# Patient Record
Sex: Female | Born: 1945 | Race: Black or African American | Hispanic: No | Marital: Single | Smoking: Never smoker
Health system: Southern US, Community
[De-identification: ages and names within clinical notes are randomized; demographics above are authoritative.]

## PROBLEM LIST (undated history)

## (undated) DIAGNOSIS — E7800 Pure hypercholesterolemia, unspecified: Secondary | ICD-10-CM

## (undated) DIAGNOSIS — E119 Type 2 diabetes mellitus without complications: Secondary | ICD-10-CM

## (undated) DIAGNOSIS — I10 Essential (primary) hypertension: Secondary | ICD-10-CM

## (undated) HISTORY — DX: Essential (primary) hypertension: I10

## (undated) HISTORY — DX: Pure hypercholesterolemia, unspecified: E78.00

## (undated) HISTORY — DX: Type 2 diabetes mellitus without complications: E11.9

## (undated) HISTORY — PX: OTHER SURGICAL HISTORY: SHX169

## (undated) HISTORY — PX: ABDOMINAL HYSTERECTOMY: SUR658

## (undated) HISTORY — PX: BREAST EXCISIONAL BIOPSY: SUR124

---

## 1979-04-17 HISTORY — PX: PARTIAL HYSTERECTOMY: SHX80

## 1988-04-16 HISTORY — PX: CERVICAL FUSION: SHX112

## 2001-04-16 HISTORY — PX: OTHER SURGICAL HISTORY: SHX169

## 2016-04-16 HISTORY — PX: OTHER SURGICAL HISTORY: SHX169

## 2016-07-11 DIAGNOSIS — M8588 Other specified disorders of bone density and structure, other site: Secondary | ICD-10-CM | POA: Diagnosis not present

## 2017-02-15 DIAGNOSIS — Z23 Encounter for immunization: Secondary | ICD-10-CM | POA: Diagnosis not present

## 2017-02-15 DIAGNOSIS — J309 Allergic rhinitis, unspecified: Secondary | ICD-10-CM | POA: Diagnosis not present

## 2017-02-15 DIAGNOSIS — I10 Essential (primary) hypertension: Secondary | ICD-10-CM | POA: Diagnosis not present

## 2017-02-15 DIAGNOSIS — E785 Hyperlipidemia, unspecified: Secondary | ICD-10-CM | POA: Diagnosis not present

## 2017-02-15 DIAGNOSIS — E119 Type 2 diabetes mellitus without complications: Secondary | ICD-10-CM | POA: Diagnosis not present

## 2017-03-13 ENCOUNTER — Other Ambulatory Visit: Payer: Self-pay | Admitting: Family Medicine

## 2017-03-13 ENCOUNTER — Ambulatory Visit
Admission: RE | Admit: 2017-03-13 | Discharge: 2017-03-13 | Disposition: A | Discharge status: Home / Self Care | Payer: Medicare Other | Source: Ambulatory Visit | Attending: Family Medicine | Admitting: Family Medicine

## 2017-03-13 DIAGNOSIS — M7521 Bicipital tendinitis, right shoulder: Secondary | ICD-10-CM | POA: Diagnosis not present

## 2017-03-13 DIAGNOSIS — E785 Hyperlipidemia, unspecified: Secondary | ICD-10-CM | POA: Diagnosis not present

## 2017-03-13 DIAGNOSIS — M7581 Other shoulder lesions, right shoulder: Secondary | ICD-10-CM | POA: Diagnosis not present

## 2017-03-13 DIAGNOSIS — E119 Type 2 diabetes mellitus without complications: Secondary | ICD-10-CM | POA: Diagnosis not present

## 2017-03-13 DIAGNOSIS — I10 Essential (primary) hypertension: Secondary | ICD-10-CM | POA: Diagnosis not present

## 2017-03-13 DIAGNOSIS — M25511 Pain in right shoulder: Secondary | ICD-10-CM

## 2017-03-13 DIAGNOSIS — M7501 Adhesive capsulitis of right shoulder: Secondary | ICD-10-CM | POA: Diagnosis not present

## 2017-03-13 DIAGNOSIS — M79601 Pain in right arm: Secondary | ICD-10-CM | POA: Diagnosis not present

## 2017-10-08 ENCOUNTER — Other Ambulatory Visit: Payer: Self-pay | Admitting: Physician Assistant

## 2017-10-08 DIAGNOSIS — Z1231 Encounter for screening mammogram for malignant neoplasm of breast: Secondary | ICD-10-CM

## 2017-10-11 ENCOUNTER — Other Ambulatory Visit: Payer: Self-pay | Admitting: Internal Medicine

## 2017-10-11 DIAGNOSIS — E042 Nontoxic multinodular goiter: Secondary | ICD-10-CM

## 2017-10-22 ENCOUNTER — Ambulatory Visit
Admission: RE | Admit: 2017-10-22 | Discharge: 2017-10-22 | Disposition: A | Discharge status: Home / Self Care | Payer: Medicare Other | Source: Ambulatory Visit | Attending: Internal Medicine | Admitting: Internal Medicine

## 2017-10-22 DIAGNOSIS — E042 Nontoxic multinodular goiter: Secondary | ICD-10-CM

## 2017-10-25 ENCOUNTER — Ambulatory Visit
Admission: RE | Admit: 2017-10-25 | Discharge: 2017-10-25 | Disposition: A | Discharge status: Home / Self Care | Payer: Medicare Other | Source: Ambulatory Visit | Attending: Physician Assistant | Admitting: Physician Assistant

## 2017-10-25 DIAGNOSIS — Z1231 Encounter for screening mammogram for malignant neoplasm of breast: Secondary | ICD-10-CM

## 2018-03-11 ENCOUNTER — Other Ambulatory Visit: Payer: Self-pay | Admitting: Physician Assistant

## 2018-03-11 DIAGNOSIS — R160 Hepatomegaly, not elsewhere classified: Secondary | ICD-10-CM

## 2018-03-29 ENCOUNTER — Ambulatory Visit
Admission: RE | Admit: 2018-03-29 | Discharge: 2018-03-29 | Disposition: A | Discharge status: Home / Self Care | Payer: Medicare Other | Source: Ambulatory Visit | Attending: Physician Assistant | Admitting: Physician Assistant

## 2018-03-29 DIAGNOSIS — R160 Hepatomegaly, not elsewhere classified: Secondary | ICD-10-CM

## 2018-03-29 MED ORDER — GADOBENATE DIMEGLUMINE 529 MG/ML IV SOLN
20.0000 mL | Freq: Once | INTRAVENOUS | Status: AC | PRN
  Administered 2018-03-29: 20 mL via INTRAVENOUS

## 2018-10-03 ENCOUNTER — Other Ambulatory Visit: Payer: Self-pay | Admitting: Physician Assistant

## 2018-10-03 DIAGNOSIS — Z1231 Encounter for screening mammogram for malignant neoplasm of breast: Secondary | ICD-10-CM

## 2018-10-15 IMAGING — CR DG SHOULDER 2+V*R*
3 series · 3 of 3 positions shown · non-contrast
Comparison: None.

CLINICAL DATA: 71-year-old female with acute right shoulder pain
and decreased range of motion after moving boxes.

EXAM:
RIGHT SHOULDER - 2+ VIEW

[w shoulder grashey right]
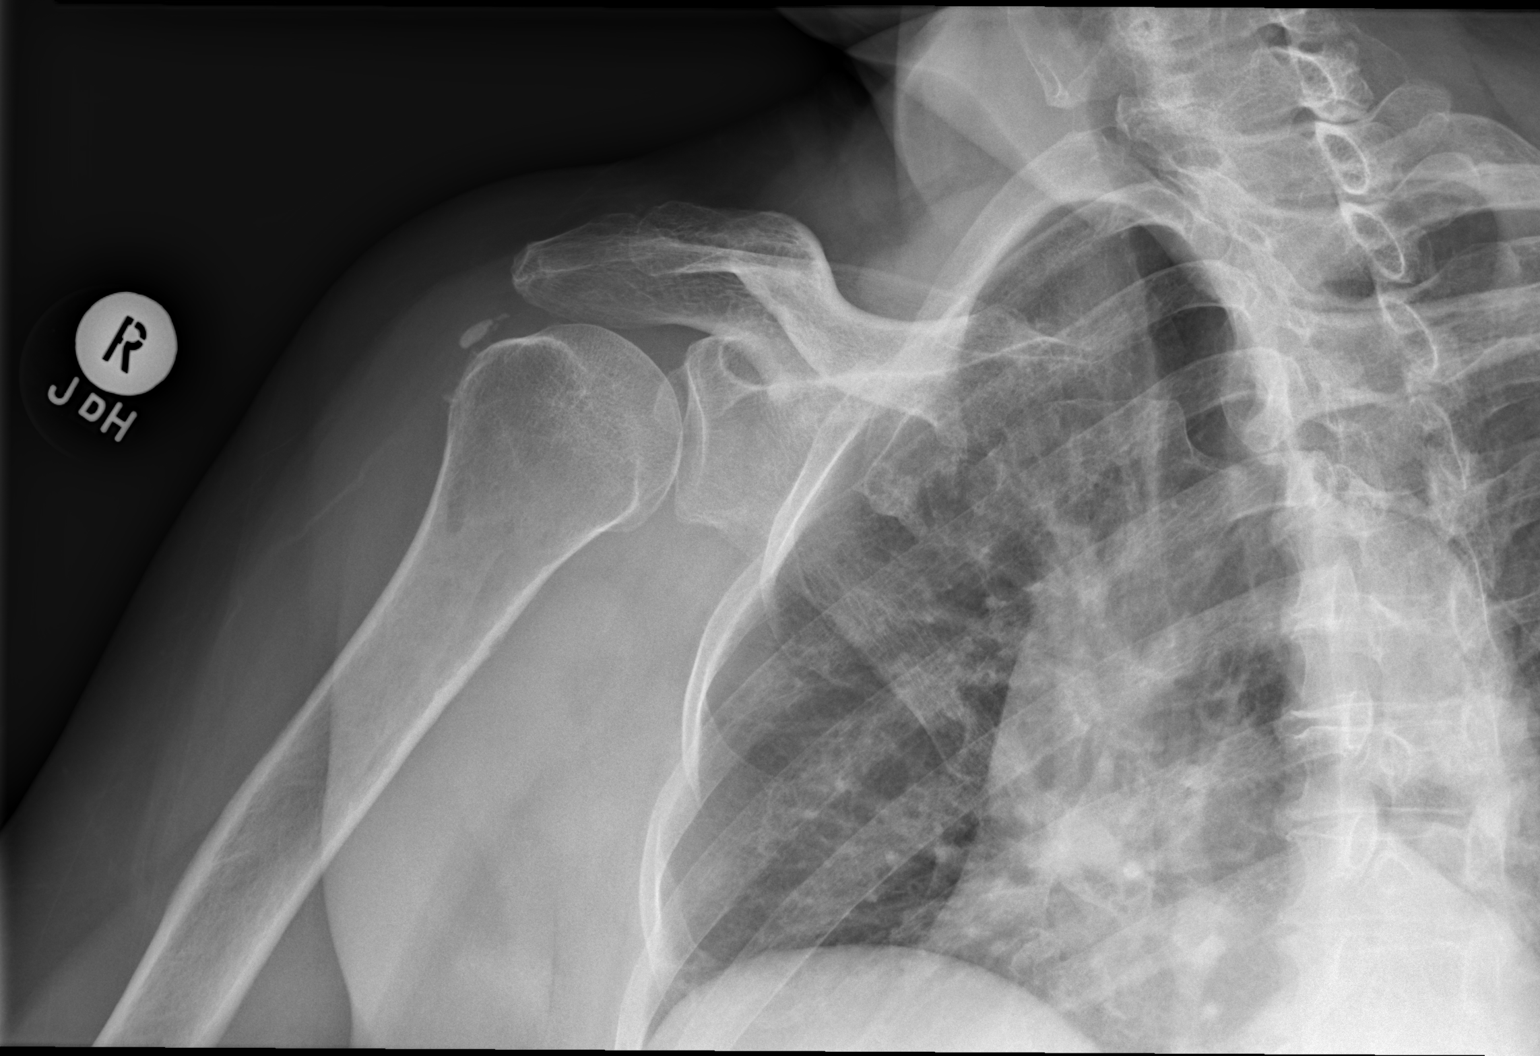

[w shoulder y-view right]
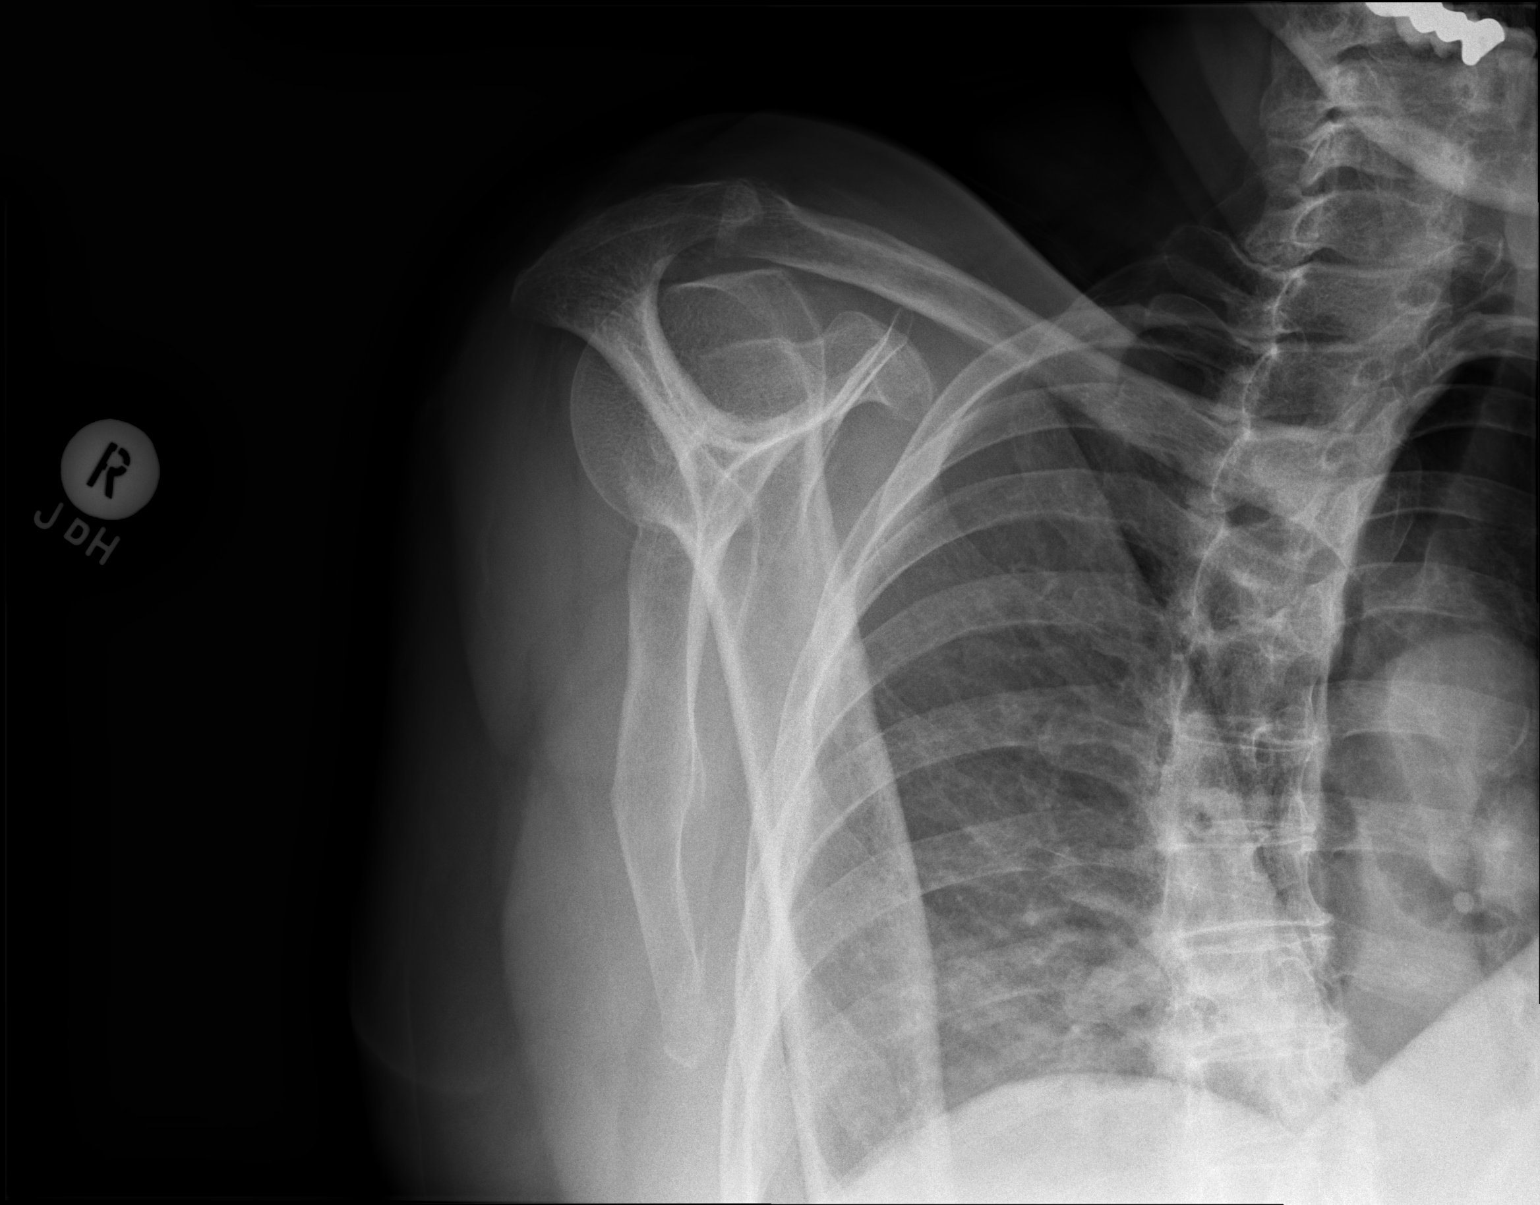

[w shoulder axillary right]
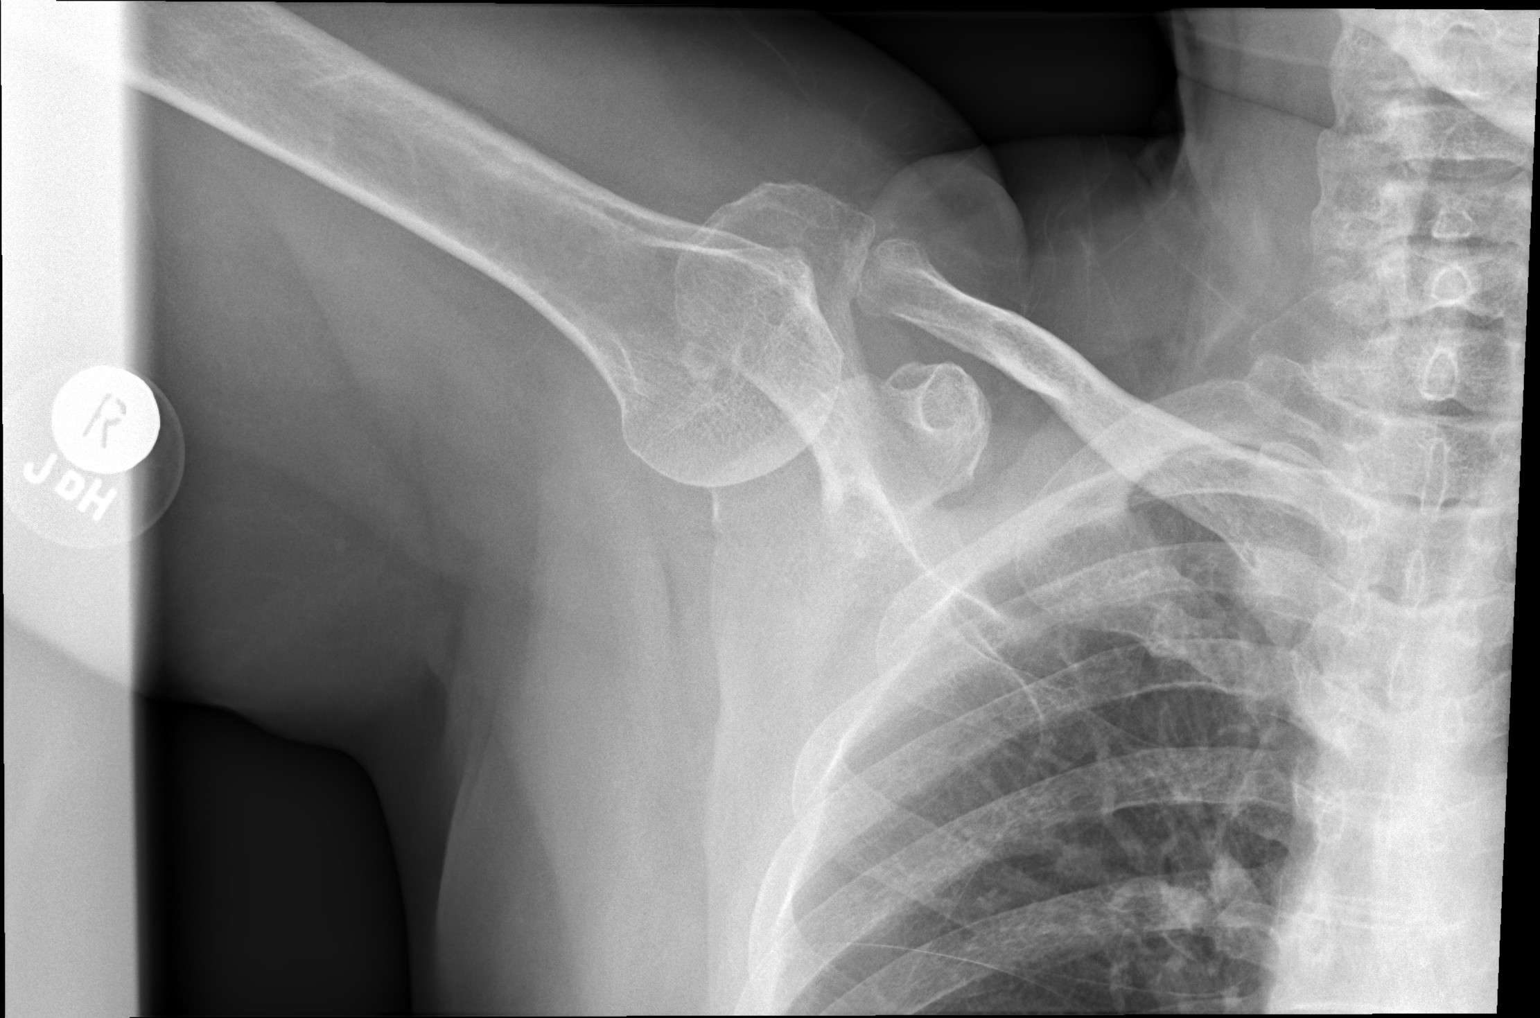

[3 of 3 positions shown; findings below may reference images not displayed]

FINDINGS: No evidence of acute fracture or malalignment. Soft tissue
calcifications are present in the region of the rotator cuff
insertion adjacent to the greater tuberosity. No significant
degenerative osteoarthritis. The visualized lungs are clear. No
lytic or blastic osseous lesion.
IMPRESSION: 1. Chronic calcific tendinosis in the region of the rotator cuff
insertion.
2. No evidence of significant arthritic changes in the
acromioclavicular or glenohumeral joints.

## 2018-11-19 ENCOUNTER — Other Ambulatory Visit: Payer: Self-pay

## 2018-11-19 ENCOUNTER — Ambulatory Visit
Admission: RE | Admit: 2018-11-19 | Discharge: 2018-11-19 | Disposition: A | Discharge status: Home / Self Care | Payer: Medicare Other | Source: Ambulatory Visit | Attending: Physician Assistant | Admitting: Physician Assistant

## 2018-11-19 DIAGNOSIS — Z1231 Encounter for screening mammogram for malignant neoplasm of breast: Secondary | ICD-10-CM

## 2019-07-18 ENCOUNTER — Other Ambulatory Visit: Payer: Self-pay

## 2019-07-18 ENCOUNTER — Encounter (HOSPITAL_COMMUNITY): Payer: Self-pay

## 2019-07-18 ENCOUNTER — Emergency Department (HOSPITAL_COMMUNITY): Payer: Medicare Other

## 2019-07-18 ENCOUNTER — Emergency Department (HOSPITAL_COMMUNITY)
Admission: EM | Admit: 2019-07-18 | Discharge: 2019-07-18 | Disposition: A | Discharge status: Home / Self Care | Payer: Medicare Other | Attending: Emergency Medicine | Admitting: Emergency Medicine

## 2019-07-18 DIAGNOSIS — W101XXA Fall (on)(from) sidewalk curb, initial encounter: Secondary | ICD-10-CM | POA: Insufficient documentation

## 2019-07-18 DIAGNOSIS — S4991XA Unspecified injury of right shoulder and upper arm, initial encounter: Secondary | ICD-10-CM | POA: Diagnosis present

## 2019-07-18 DIAGNOSIS — Y999 Unspecified external cause status: Secondary | ICD-10-CM | POA: Diagnosis not present

## 2019-07-18 DIAGNOSIS — Y9389 Activity, other specified: Secondary | ICD-10-CM | POA: Diagnosis not present

## 2019-07-18 DIAGNOSIS — S42201A Unspecified fracture of upper end of right humerus, initial encounter for closed fracture: Secondary | ICD-10-CM | POA: Insufficient documentation

## 2019-07-18 DIAGNOSIS — Y9289 Other specified places as the place of occurrence of the external cause: Secondary | ICD-10-CM | POA: Insufficient documentation

## 2019-07-18 MED ORDER — OXYCODONE-ACETAMINOPHEN 5-325 MG PO TABS: 1.0000 | ORAL_TABLET | Freq: Four times a day (QID) | ORAL | 0 refills | Status: DC | PRN

## 2019-07-18 MED ORDER — FENTANYL CITRATE (PF) 100 MCG/2ML IJ SOLN
50.0000 ug | Freq: Once | INTRAMUSCULAR | Status: AC
  Administered 2019-07-18: 50 ug via INTRAVENOUS
  Filled 2019-07-18: qty 2

## 2019-07-18 MED ORDER — ONDANSETRON HCL 4 MG/2ML IJ SOLN
4.0000 mg | Freq: Once | INTRAMUSCULAR | Status: AC
  Administered 2019-07-18: 4 mg via INTRAVENOUS
  Filled 2019-07-18: qty 2

## 2019-07-18 MED ORDER — OXYCODONE-ACETAMINOPHEN 5-325 MG PO TABS
1.0000 | ORAL_TABLET | Freq: Once | ORAL | Status: AC
  Administered 2019-07-18: 1 via ORAL
  Filled 2019-07-18: qty 1

## 2019-07-18 NOTE — ED Provider Notes (Signed)
[facility] EMERGENCY DEPARTMENT Provider Note   CSN: [id number] Arrival date & time: 07/18/19  1332     History Chief Complaint  Patient presents with  . mechanical fall    Laura Skinner is a 74 y.o. female.  The history is provided by the patient and the EMS personnel. No language interpreter was used.     74 year old female brought here via EMS for evaluation of recent fall.  Patient report prior to arrival she was walking towards her car when she accidentally tripped over the curb, fell and landed directly on her right shoulder.  She denies hitting her head or loss of consciousness.  She report acute onset of sharp intense 10 out of 10 pain about the shoulder with difficulty ranging the shoulder due to pain.  EMS was contacted and patient was given 100 mcg of fentanyl prior to arrival.  She did report pain medication did help with her discomfort.  She denies headache, neck pain, chest pain or trouble breathing, abdominal pain, wrist pain, elbow pain.  She is right-hand dominant.  She is not on any blood thinner medication.  She denies any precipitating symptoms prior to the fall.  No past medical history on file.  There are no problems to display for this patient.   Past Surgical History:  Procedure Laterality Date  . BREAST EXCISIONAL BIOPSY Left      OB History   No obstetric history on file.     Family History  Problem Relation Age of Onset  . Breast cancer Cousin     Social History   Tobacco Use  . Smoking status: Never Smoker  . Smokeless tobacco: Never Used  Substance Use Topics  . Alcohol use: Not Currently  . Drug use: Not Currently    Home Medications Prior to Admission medications   Not on File    Allergies    Patient has no known allergies.  Review of Systems   Review of Systems  All other systems reviewed and are negative.   Physical Exam Updated Vital Signs BP (!) 148/57   Pulse 88   Temp 98.3 F (36.8 C) (Oral)    Resp 14   Ht 5\' 4"  (1.626 m)   Wt 102.5 kg   SpO2 98%   BMI 38.79 kg/m   Physical Exam Vitals and nursing note reviewed.  Constitutional:      General: She is not in acute distress.    Appearance: She is well-developed. She is obese.  HENT:     Head: Normocephalic and atraumatic.  Eyes:     Conjunctiva/sclera: Conjunctivae normal.  Cardiovascular:     Rate and Rhythm: Normal rate and regular rhythm.     Pulses: Normal pulses.     Heart sounds: Normal heart sounds.  Pulmonary:     Effort: Pulmonary effort is normal.     Breath sounds: Normal breath sounds.     Comments: No chest tenderness, no clavicular tenderness. Chest:     Chest wall: No tenderness.  Abdominal:     Palpations: Abdomen is soft.  Musculoskeletal:        General: Tenderness (Right shoulder: Exquisite tenderness to the palpation of the shoulder joint with decreased range of motion secondary to pain.  No obvious deformity or crepitus noted.  Sensation is intact.) present.     Cervical back: Neck supple.     Comments: No tenderness to right elbow or right wrist.  Radial pulse 2+.  No scalp tenderness.  No cervical midline spine tenderness.  Skin:    Findings: No rash.  Neurological:     Mental Status: She is alert.     ED Results / Procedures / Treatments   Labs (all labs ordered are listed, but only abnormal results are displayed) Labs Reviewed - No data to display  EKG None  Radiology DG Shoulder Right  Result Date: 07/18/2019 CLINICAL DATA:  Fall EXAM: RIGHT SHOULDER - 2+ VIEW COMPARISON:  03/13/2017 FINDINGS: Comminuted, displaced fractures of the proximal right humerus, involving at least the greater tuberosity and likely the surgical neck. Mild acromioclavicular arthrosis. The partially imaged right chest is unremarkable. IMPRESSION: Comminuted, displaced fractures of the proximal right humerus, involving at least the greater tuberosity and likely the surgical neck. Consider CT to better evaluate  fracture anatomy. Electronically Signed   By: Eddie Candle M.D.   On: 07/18/2019 14:54    Procedures Procedures (including critical care time)  Medications Ordered in ED Medications  fentaNYL (SUBLIMAZE) injection 50 mcg (50 mcg Intravenous Given 07/18/19 1420)  ondansetron (ZOFRAN) injection 4 mg (4 mg Intravenous Given 07/18/19 1419)  oxyCODONE-acetaminophen (PERCOCET/ROXICET) 5-325 MG per tablet 1 tablet (1 tablet Oral Given 07/18/19 1534)    ED Course  I have reviewed the triage vital signs and the nursing notes.  Pertinent labs & imaging results that were available during my care of the patient were reviewed by me and considered in my medical decision making (see chart for details).    MDM Rules/Calculators/A&P                      BP (!) 151/61   Pulse 88   Temp 98.3 F (36.8 C) (Oral)   Resp 13   Ht 5\' 4"  (1.626 m)   Wt 102.5 kg   SpO2 98%   BMI 38.79 kg/m   Final Clinical Impression(s) / ED Diagnoses Final diagnoses:  Closed fracture of proximal end of right humerus, initial encounter    Rx / DC Orders ED Discharge Orders         Ordered    oxyCODONE-acetaminophen (PERCOCET) 5-325 MG tablet  Every 6 hours PRN     07/18/19 1532         2:04 PM Patient had mechanical fall, when she tripped on a curb, fell, and landed directly on her right shoulder.  No other injury noted aside from exquisite pain about the shoulder.  Will obtain x-ray.  Pain medication given.  Patient is neurovascular intact.  3:23 PM Xray of R shoulder demonstrates comminuted displaced fractures of the proximal right humerus, involving at least the greater tubrosity and likely surgical neck.  COnsider CT to better evaluate fracture anatomy.  I discussed care with Dr. Wilson Singer who recommend sling and pt can f/u outpt with orthopedist next week for further management.  I discussed care with pt who agrees.  Will provide sling and will prescribe opiate pain medication.  Pt understands opiate can cause  drowsiness.  Return precaution given.     Domenic Moras, PA-C 07/18/19 1534    Charlesetta Shanks, MD 07/28/19 1511

## 2019-07-18 NOTE — ED Triage Notes (Signed)
[facility] reports pt fell by tripping over curbing. No loc, weakness, only mechanical. C/O  Right shoulder pain. Pt given a total of 100 mcg of fentanyl. Pt was HTN, with hx of HTN, HLD, and DM. Reports morning meds taken. Son aware and will be coming to hospital.

## 2019-07-18 NOTE — Discharge Instructions (Signed)
You have suffered a broken right shoulder.  Wear sling for support.  Take pain medication as prescribed but be aware that it may cause drowsiness.  Call and follow up with orthopedist next week for further management.

## 2019-07-18 NOTE — ED Notes (Signed)
Patient verbalizes understanding of discharge instructions. Opportunity for questioning and answers were provided. Armband removed by staff, pt discharged from ED.  

## 2019-07-18 NOTE — Progress Notes (Signed)
Orthopedic Tech Progress Note Patient Details:  Laura Skinner 04-29-1945 [id number]  Ortho Devices Type of Ortho Device: Arm sling       [name] 07/18/2019, 4:06 PM

## 2019-07-18 NOTE — ED Notes (Signed)
Wasted 50 mcg 1 ml of fentanyl with Fraser Din RN

## 2019-07-18 NOTE — ED Notes (Signed)
Patient transported to X-ray 

## 2019-07-18 NOTE — ED Notes (Signed)
This RN witnessed the waste of Fentanyl with [employer]

## 2019-07-20 ENCOUNTER — Other Ambulatory Visit: Payer: Self-pay

## 2019-07-20 ENCOUNTER — Encounter: Payer: Self-pay | Admitting: Orthopedic Surgery

## 2019-07-20 ENCOUNTER — Ambulatory Visit: Payer: Medicare Other | Admitting: Orthopedic Surgery

## 2019-07-20 VITALS — Ht 64.0 in | Wt 226.0 lb

## 2019-07-20 DIAGNOSIS — S42291A Other displaced fracture of upper end of right humerus, initial encounter for closed fracture: Secondary | ICD-10-CM | POA: Diagnosis not present

## 2019-07-20 MED ORDER — HYDROCODONE-ACETAMINOPHEN 5-325 MG PO TABS: 1.0000 | ORAL_TABLET | ORAL | 0 refills | Status: DC | PRN

## 2019-07-20 NOTE — Progress Notes (Signed)
   Office Visit Note   Patient: Laura Skinner           Date of Birth: July 21, 1945           MRN: [id number] Visit Date: 07/20/2019              Requested by: No referring provider defined for this encounter. PCP: Hoyt Koch, MD (Inactive)  Chief Complaint  Patient presents with  . Right Shoulder - Pain    S/p fall 07/18/2019 follow up from ER       HPI: Patient is a 74 year old woman who fell onto her right shoulder 2 days ago.  She sustained a comminuted fracture of the right proximal humerus.  She is seen for initial evaluation in a sling she complains of bruising and pain she states the Percocet from the hospital made her nauseated.  Past medical history negative for tobacco negative for diabetes.  Assessment & Plan: Visit Diagnoses:  1. Closed 3-part fracture of proximal end of right humerus, initial encounter     Plan: Recommended proceeding with conservative treatment initially.  Will have her use a sling do pendulum exercises work on range of motion exercises of the hand wrist and elbow and repeat 2 view radiographs of the right shoulder at follow-up.  Prescription for Vicodin written.  Follow-Up Instructions: Return in about 3 weeks (around 08/10/2019).   Ortho Exam  Patient is alert, oriented, no adenopathy, well-dressed, normal affect, normal respiratory effort. Examination patient has ecchymosis and bruising in the right shoulder region the clavicle and AC joint are not tender to palpation.  Radiographs are reviewed which shows an active minimal three-part proximal humerus fracture with a reduced glenohumeral joint.  Imaging: No results found. No images are attached to the encounter.  Labs: No results found for: HGBA1C, ESRSEDRATE, CRP, LABURIC, REPTSTATUS, GRAMSTAIN, CULT, LABORGA   No results found for: ALBUMIN, PREALBUMIN, LABURIC  No results found for: MG No results found for: VD25OH  No results found for: PREALBUMIN No flowsheet data found.   Body mass  index is 38.79 kg/m.  Orders:  No orders of the defined types were placed in this encounter.  Meds ordered this encounter  Medications  . HYDROcodone-acetaminophen (NORCO/VICODIN) 5-325 MG tablet    Sig: Take 1 tablet by mouth every 4 (four) hours as needed for moderate pain.    Dispense:  30 tablet    Refill:  0     Procedures: No procedures performed  Clinical Data: No additional findings.  ROS:  All other systems negative, except as noted in the HPI. Review of Systems  Objective: Vital Signs: Ht 5\' 4"  (1.626 m)   Wt 226 lb (102.5 kg)   BMI 38.79 kg/m   Specialty Comments:  No specialty comments available.  PMFS History: There are no problems to display for this patient.  History reviewed. No pertinent past medical history.  Family History  Problem Relation Age of Onset  . Breast cancer Cousin     Past Surgical History:  Procedure Laterality Date  . BREAST EXCISIONAL BIOPSY Left    Social History   Occupational History  . Not on file  Tobacco Use  . Smoking status: Never Smoker  . Smokeless tobacco: Never Used  Substance and Sexual Activity  . Alcohol use: Not Currently  . Drug use: Not Currently  . Sexual activity: Not on file

## 2019-08-10 ENCOUNTER — Ambulatory Visit: Payer: Medicare Other | Admitting: Orthopedic Surgery

## 2019-08-10 ENCOUNTER — Telehealth: Payer: Self-pay

## 2019-08-10 ENCOUNTER — Encounter: Payer: Self-pay | Admitting: Orthopedic Surgery

## 2019-08-10 ENCOUNTER — Other Ambulatory Visit: Payer: Self-pay

## 2019-08-10 ENCOUNTER — Ambulatory Visit: Payer: Self-pay

## 2019-08-10 DIAGNOSIS — G8929 Other chronic pain: Secondary | ICD-10-CM | POA: Diagnosis not present

## 2019-08-10 DIAGNOSIS — M25511 Pain in right shoulder: Secondary | ICD-10-CM

## 2019-08-10 DIAGNOSIS — M7541 Impingement syndrome of right shoulder: Secondary | ICD-10-CM

## 2019-08-10 MED ORDER — METHYLPREDNISOLONE ACETATE 40 MG/ML IJ SUSP: 40.0000 mg | INTRAMUSCULAR | Status: DC | PRN

## 2019-08-10 MED ORDER — LIDOCAINE HCL 1 % IJ SOLN: 5.0000 mL | INTRAMUSCULAR | Status: DC | PRN

## 2019-08-10 NOTE — Progress Notes (Signed)
   Office Visit Note   Patient: Laura Skinner           Date of Birth: 1946-04-13           MRN: [id number] Visit Date: 08/10/2019              Requested by: No referring provider defined for this encounter. PCP: Hoyt Koch, MD (Inactive)  Chief Complaint  Patient presents with  . Right Shoulder - Follow-up    DOI 07/16/19 f/u s/p fall proximal humerus fx       HPI: This is a pleasant woman who is now 3 weeks status post right comminuted proximal humerus fracture.  She has been doing well.  She has been working on pendulum exercises on her own.  She admits she is leery about removing the sling but is gotten around this to keep mobility in her arm  Assessment & Plan: Visit Diagnoses:  1. Chronic right shoulder pain   2. Impingement syndrome of right shoulder     Plan: The patient lives alone and is homebound because of her fracture.  We will have her start physical therapy for active and passive range of motion of her right shoulder elbow and wrist.  And some progression to just very light strengthening.  Follow-up in 3 weeks at which time x-rays of her right shoulder should be taken.  Follow-Up Instructions: Return in about 4 weeks (around 09/07/2019).   Ortho Exam  Patient is alert, oriented, no adenopathy, well-dressed, normal affect, normal respiratory effort. Right arm: Distal CMS is intact she has active extension and flexion of her wrist.  Minimal soft tissue swelling minimally tender to palpation over the shoulder  Imaging: XR Shoulder Right  Result Date: 08/10/2019 2 view radiographs of the right shoulder shows no bony abnormalities.  No images are attached to the encounter.  Labs: No results found for: HGBA1C, ESRSEDRATE, CRP, LABURIC, REPTSTATUS, GRAMSTAIN, CULT, LABORGA   No results found for: ALBUMIN, PREALBUMIN, LABURIC  No results found for: MG No results found for: VD25OH  No results found for: PREALBUMIN No flowsheet data found.   There is no  height or weight on file to calculate BMI.  Orders:  Orders Placed This Encounter  Procedures  . Large Joint Inj: R subacromial bursa  . XR Shoulder Right   No orders of the defined types were placed in this encounter.    Procedures: No procedures performed  Clinical Data: No additional findings.  ROS:  All other systems negative, except as noted in the HPI. Review of Systems  Objective: Vital Signs: There were no vitals taken for this visit.  Specialty Comments:  No specialty comments available.  PMFS History: There are no problems to display for this patient.  History reviewed. No pertinent past medical history.  Family History  Problem Relation Age of Onset  . Breast cancer Cousin     Past Surgical History:  Procedure Laterality Date  . BREAST EXCISIONAL BIOPSY Left    Social History   Occupational History  . Not on file  Tobacco Use  . Smoking status: Never Smoker  . Smokeless tobacco: Never Used  Substance and Sexual Activity  . Alcohol use: Not Currently  . Drug use: Not Currently  . Sexual activity: Not on file

## 2019-08-10 NOTE — Progress Notes (Addendum)
   Office Visit Note   Patient: Laura Skinner           Date of Birth: 1945-10-09           MRN: [id number] Visit Date: 08/10/2019              Requested by: No referring provider defined for this encounter. PCP: Hoyt Koch, MD (Inactive)  Chief Complaint  Patient presents with  . Right Shoulder - Follow-up    DOI 07/16/19 f/u s/p fall proximal humerus fx       HPI: Patient is a 74 year old woman who presents in follow-up for a proximal right humerus fracture.  She is about 4 weeks out from her injury.   Assessment & Plan: Visit Diagnoses:  1. Chronic right shoulder pain   2. Impingement syndrome of right shoulder     Plan: Patient states that she cannot get out of the house on her own we will try to set her up for home health therapy for range of motion and strengthening of the right shoulder.  Follow-Up Instructions: Return in about 4 weeks (around 09/07/2019).   Ortho Exam  Patient is alert, oriented, no adenopathy, well-dressed, normal affect, normal respiratory effort. Examination patient has decreased range of motion of the right shoulder.  There is no skin breakdown.  Radiographs shows consolidating fracture of the proximal humerus.  Imaging: XR Shoulder Right  Result Date: 08/10/2019 2 view radiographs of the right shoulder shows no bony abnormalities.  No images are attached to the encounter.  Labs: No results found for: HGBA1C, ESRSEDRATE, CRP, LABURIC, REPTSTATUS, GRAMSTAIN, CULT, LABORGA   No results found for: ALBUMIN, PREALBUMIN, LABURIC  No results found for: MG No results found for: VD25OH  No results found for: PREALBUMIN No flowsheet data found.   There is no height or weight on file to calculate BMI.  Orders:  Orders Placed This Encounter  Procedures  . XR Shoulder Right   No orders of the defined types were placed in this encounter.    Procedures: No procedures performed  Clinical Data: No additional findings.  ROS:  All other  systems negative, except as noted in the HPI. Review of Systems  Objective: Vital Signs: There were no vitals taken for this visit.  Specialty Comments:  No specialty comments available.  PMFS History: There are no problems to display for this patient.  History reviewed. No pertinent past medical history.  Family History  Problem Relation Age of Onset  . Breast cancer Cousin     Past Surgical History:  Procedure Laterality Date  . BREAST EXCISIONAL BIOPSY Left    Social History   Occupational History  . Not on file  Tobacco Use  . Smoking status: Never Smoker  . Smokeless tobacco: Never Used  Substance and Sexual Activity  . Alcohol use: Not Currently  . Drug use: Not Currently  . Sexual activity: Not on file

## 2019-08-10 NOTE — Telephone Encounter (Signed)
Referral for HHPT faxed to [facility] for physical therapy eval and treat right proximal humeral fx work on ROM and light strengthening.

## 2019-08-17 ENCOUNTER — Telehealth: Payer: Self-pay | Admitting: Orthopedic Surgery

## 2019-08-17 NOTE — Telephone Encounter (Signed)
Patient called. Says she would like for Autumn to call her. She says she has not heard anything from anyone about physical therapy. Her call back number is [phone]

## 2019-08-17 NOTE — Telephone Encounter (Signed)
Called Laura Skinner with [facility] and he received the referral and will contact the office once processed.

## 2019-08-18 NOTE — Telephone Encounter (Signed)
Pt on sch for this week with physical therapy.

## 2019-09-07 ENCOUNTER — Ambulatory Visit: Payer: Medicare Other | Admitting: Orthopedic Surgery

## 2019-09-08 ENCOUNTER — Ambulatory Visit: Payer: Medicare Other | Admitting: Physician Assistant

## 2019-09-08 ENCOUNTER — Encounter: Payer: Self-pay | Admitting: Orthopedic Surgery

## 2019-09-08 ENCOUNTER — Other Ambulatory Visit: Payer: Self-pay

## 2019-09-08 VITALS — Ht 64.0 in | Wt 226.0 lb

## 2019-09-08 DIAGNOSIS — S42291A Other displaced fracture of upper end of right humerus, initial encounter for closed fracture: Secondary | ICD-10-CM

## 2019-09-08 NOTE — Progress Notes (Signed)
   Office Visit Note   Patient: Laura Skinner           Date of Birth: [birth year]           MRN: [id number] Visit Date: 09/08/2019              Requested by: No referring provider defined for this encounter. PCP: Hoyt Koch, MD (Inactive)  Chief Complaint  Patient presents with  . Right Shoulder - Follow-up    DOI 07/16/19 s/p fall proximal humerus fracture.       HPI: This is a pleasant 74 year old woman who is 6 weeks status post fall and fracturing her proximal humerus.  On the right.  She is doing well and has been very successful with home physical therapy she cannot drive because it is her right arm so she is homebound.  She feels she is making progress and her pain has decreased  Assessment & Plan: Visit Diagnoses: No diagnosis found.  Plan: She should continue with physical therapy as I believe there is still in need in follow-up for final visit in 6 weeks.  We should get x-rays of her right ankle shoulder at that timeshpulder at that time  Follow-Up Instructions: No follow-ups on file.   Ortho Exam  Patient is alert, oriented, no adenopathy, well-dressed, normal affect, normal respiratory effort. Focused examination of her right shoulder demonstrates some tenderness to deep palpation over the proximal humerus.  She has abduction to 80 degrees.  She has internal rotation to her beltline.  Forward elevation to about 90 degrees  Imaging: No results found. No images are attached to the encounter.  Labs: No results found for: HGBA1C, ESRSEDRATE, CRP, LABURIC, REPTSTATUS, GRAMSTAIN, CULT, LABORGA   No results found for: ALBUMIN, PREALBUMIN, LABURIC  No results found for: MG No results found for: VD25OH  No results found for: PREALBUMIN No flowsheet data found.   Body mass index is 38.79 kg/m.  Orders:  No orders of the defined types were placed in this encounter.  No orders of the defined types were placed in this encounter.    Procedures: No procedures  performed  Clinical Data: No additional findings.  ROS:  All other systems negative, except as noted in the HPI. Review of Systems  Objective: Vital Signs: Ht 5\' 4"  (1.626 m)   Wt 226 lb (102.5 kg)   BMI 38.79 kg/m   Specialty Comments:  No specialty comments available.  PMFS History: There are no problems to display for this patient.  No past medical history on file.  Family History  Problem Relation Age of Onset  . Breast cancer Cousin     Past Surgical History:  Procedure Laterality Date  . BREAST EXCISIONAL BIOPSY Left    Social History   Occupational History  . Not on file  Tobacco Use  . Smoking status: Never Smoker  . Smokeless tobacco: Never Used  Substance and Sexual Activity  . Alcohol use: Not Currently  . Drug use: Not Currently  . Sexual activity: Not on file

## 2019-09-28 ENCOUNTER — Other Ambulatory Visit: Payer: Self-pay | Admitting: Physician Assistant

## 2019-09-28 DIAGNOSIS — Z1231 Encounter for screening mammogram for malignant neoplasm of breast: Secondary | ICD-10-CM

## 2019-10-20 ENCOUNTER — Ambulatory Visit: Payer: Self-pay

## 2019-10-20 ENCOUNTER — Other Ambulatory Visit: Payer: Self-pay

## 2019-10-20 ENCOUNTER — Ambulatory Visit (INDEPENDENT_AMBULATORY_CARE_PROVIDER_SITE_OTHER): Payer: Medicare Other | Admitting: Physician Assistant

## 2019-10-20 ENCOUNTER — Encounter: Payer: Self-pay | Admitting: Orthopedic Surgery

## 2019-10-20 VITALS — Ht 64.0 in | Wt 226.0 lb

## 2019-10-20 DIAGNOSIS — S42291A Other displaced fracture of upper end of right humerus, initial encounter for closed fracture: Secondary | ICD-10-CM

## 2019-10-20 NOTE — Progress Notes (Signed)
   Office Visit Note   Patient: Laura Skinner           Date of Birth: [birth year]           MRN: [id number] Visit Date: 10/20/2019              Requested by: No referring provider defined for this encounter. PCP: Hoyt Koch, MD (Inactive)  Chief Complaint  Patient presents with  . Right Shoulder - Follow-up    DOI 07/16/19 s/p fall proximal humerus fracture.      HPI: This is a pleasant 74 year old woman who is 3 months status post proximal humerus fracture on the right.  She has been working with physical therapy and completed her PT.  She did not 100% but she does feel quite a bit better.  She is wondering if further PT with an emphasis on strengthening would be helpful  Assessment & Plan: Visit Diagnoses:  1. Closed 3-part fracture of proximal end of right humerus, initial encounter     Plan: I am happy to write for more physical therapy.  She is going to go to [place] until August.  I told her to use the shoulder as she wishes and to keep it stretched out.  When she returns if she feels she would like to continue with PT she may contact our office  Follow-Up Instructions: No follow-ups on file.   Ortho Exam  Patient is alert, oriented, no adenopathy, well-dressed, normal affect, normal respiratory effort. Right shoulder: No soft tissue swelling mildly tender to deep palpation.  She has internal rotation to just above her beltline she has abduction to almost 90 degrees.  Strength is almost equivalent bilaterally sensation is intact Imaging: No results found. No images are attached to the encounter.  Labs: No results found for: HGBA1C, ESRSEDRATE, CRP, LABURIC, REPTSTATUS, GRAMSTAIN, CULT, LABORGA   No results found for: ALBUMIN, PREALBUMIN, LABURIC  No results found for: MG No results found for: VD25OH  No results found for: PREALBUMIN No flowsheet data found.   Body mass index is 38.79 kg/m.  Orders:  Orders Placed This Encounter  Procedures  . XR Shoulder  Right   No orders of the defined types were placed in this encounter.    Procedures: No procedures performed  Clinical Data: No additional findings.  ROS:  All other systems negative, except as noted in the HPI. Review of Systems  Objective: Vital Signs: Ht 5\' 4"  (1.626 m)   Wt 226 lb (102.5 kg)   BMI 38.79 kg/m   Specialty Comments:  No specialty comments available.  PMFS History: There are no problems to display for this patient.  No past medical history on file.  Family History  Problem Relation Age of Onset  . Breast cancer Cousin     Past Surgical History:  Procedure Laterality Date  . BREAST EXCISIONAL BIOPSY Left    Social History   Occupational History  . Not on file  Tobacco Use  . Smoking status: Never Smoker  . Smokeless tobacco: Never Used  Vaping Use  . Vaping Use: Never used  Substance and Sexual Activity  . Alcohol use: Not Currently  . Drug use: Not Currently  . Sexual activity: Not on file

## 2019-10-22 ENCOUNTER — Other Ambulatory Visit: Payer: Self-pay | Admitting: Physician Assistant

## 2019-10-22 DIAGNOSIS — Z13820 Encounter for screening for osteoporosis: Secondary | ICD-10-CM

## 2019-11-20 ENCOUNTER — Ambulatory Visit: Payer: Medicare Other

## 2019-12-04 ENCOUNTER — Ambulatory Visit
Admission: RE | Admit: 2019-12-04 | Discharge: 2019-12-04 | Disposition: A | Discharge status: Home / Self Care | Payer: Medicare Other | Source: Ambulatory Visit | Attending: Physician Assistant | Admitting: Physician Assistant

## 2019-12-04 ENCOUNTER — Other Ambulatory Visit: Payer: Self-pay

## 2019-12-04 DIAGNOSIS — Z1231 Encounter for screening mammogram for malignant neoplasm of breast: Secondary | ICD-10-CM

## 2019-12-15 ENCOUNTER — Telehealth: Payer: Self-pay

## 2019-12-15 DIAGNOSIS — S42291A Other displaced fracture of upper end of right humerus, initial encounter for closed fracture: Secondary | ICD-10-CM

## 2019-12-15 NOTE — Telephone Encounter (Signed)
Patient called  Patient is calling to get PT.  Call back:[phone]

## 2019-12-15 NOTE — Telephone Encounter (Signed)
Per Corrie Dandy Anne's last office note, she will be glad to write PT rx for patient. I left voicemail for patient requesting return phone call to let us know which PT facility she would like for referral to go to.

## 2019-12-16 NOTE — Telephone Encounter (Signed)
Range of motion strengthening all sounds great

## 2019-12-16 NOTE — Telephone Encounter (Signed)
Per your last office note, ok to order PT if patient decided that she wants.  She would like to come to our PT facility.  Would you like for this to be Eval and treat-ROM and strengthening? Any special protocols or restrictions?

## 2019-12-16 NOTE — Addendum Note (Signed)
Addended by: Rogers Seeds on: 12/16/2019 02:36 PM   Modules accepted: Orders

## 2019-12-16 NOTE — Telephone Encounter (Signed)
Referral entered  

## 2019-12-23 ENCOUNTER — Ambulatory Visit: Payer: Medicare Other | Admitting: Physical Therapy

## 2019-12-23 ENCOUNTER — Other Ambulatory Visit: Payer: Self-pay

## 2019-12-23 ENCOUNTER — Encounter: Payer: Self-pay | Admitting: Physical Therapy

## 2019-12-23 DIAGNOSIS — R600 Localized edema: Secondary | ICD-10-CM | POA: Diagnosis not present

## 2019-12-23 DIAGNOSIS — M25611 Stiffness of right shoulder, not elsewhere classified: Secondary | ICD-10-CM

## 2019-12-23 DIAGNOSIS — M6281 Muscle weakness (generalized): Secondary | ICD-10-CM | POA: Diagnosis not present

## 2019-12-23 DIAGNOSIS — M25511 Pain in right shoulder: Secondary | ICD-10-CM

## 2019-12-23 NOTE — Patient Instructions (Signed)
Access Code: [email or web address] URL: [email or web address] Date: 12/23/2019 Prepared by: Ivery Quale  Exercises Supine Shoulder Flexion Extension AAROM with Dowel - 3 x daily - 7 x weekly - 10 reps - 1 sets Supine Shoulder External Rotation in 45 Degrees Abduction AAROM with Dowel - 3 x daily - 7 x weekly - 10 reps - 1 sets Shoulder Scaption AAROM with Dowel - 3 x daily - 7 x weekly - 10 reps - 1 sets Seated Shoulder Abduction Towel Slide at Table Top - 2 x daily - 6 x weekly - 10 reps - 1-2 sets - 5 hold Seated Shoulder External Rotation PROM on Table - 2-3 x daily - 6 x weekly - 10 reps - 1-2 sets - 5 hold Standing Row with Anchored Resistance - 2 x daily - 6 x weekly - 10-20 reps - 2-3 sets

## 2019-12-23 NOTE — Therapy (Signed)
[facility] Physical Therapy [address] [place], [place], [address] Phone: [phone]   Fax:  [phone]  Physical Therapy Evaluation  Patient Details  Name: Laura Skinner MRN: [id number] Date of Birth: 1946-01-27 Referring Provider (PT): Persons, [place], [place]   Encounter Date: 12/23/2019   PT End of Session - 12/23/19 1153    Visit Number 1    Number of Visits 12    Date for PT Re-Evaluation 02/17/20    Authorization Type UHC    PT Start Time 1100    PT Stop Time 1140    PT Time Calculation (min) 40 min    Activity Tolerance Patient tolerated treatment well    Behavior During Therapy [facility] for tasks assessed/performed           History reviewed. No pertinent past medical history.  Past Surgical History:  Procedure Laterality Date  . BREAST EXCISIONAL BIOPSY Left     There were no vitals filed for this visit.    Subjective Assessment - 12/23/19 1107    Subjective This is a pleasant 74 year old woman who is 3 months status post proximal humerus fracture on the right.  She has been working on PT at home.  She did not 100% but she does feel quite a bit better. MD recommending more PT.    Pertinent History DM, HTN    Limitations House hold activities;Lifting    Patient Stated Goals reduce pain    Currently in Pain? Yes    Pain Score 6     Pain Location Shoulder    Pain Orientation Right    Pain Descriptors / Indicators Aching    Pain Radiating Towards referred pain into her Rt forearm but denies N/T, or burning    Pain Onset More than a month ago    Pain Frequency Intermittent    Aggravating Factors  reaching behind back or head, reaching up and out, lifting carrying    Pain Relieving Factors ice, heat              [facility] PT Assessment - 12/23/19 0001      Assessment   Medical Diagnosis S/P Right prox humerus fracture due to fall 07/16/19    Referring Provider (PT) Persons, [place], [place]    Onset Date/Surgical Date --   non operative,  treated conserviative, fall on 07/16/19   Hand Dominance Right    Next MD Visit PRN    Prior Therapy home      Balance Screen   Has the patient fallen in the past 6 months Yes    How many times? 1    Has the patient had a decrease in activity level because of a fear of falling?  Yes    Is the patient reluctant to leave their home because of a fear of falling?  No      Home [employer] information [employer] manager residence      Prior Function   Level of Independence Independent    Vocation Retired      IT consultant   Overall Cognitive Status Within Functional Limits for tasks assessed      ROM / Strength   AROM / PROM / Strength AROM;PROM;Strength      AROM   AROM Assessment Site Shoulder    Right/Left Shoulder Right    Right Shoulder Flexion 110 Degrees    Right Shoulder ABduction 90 Degrees    Right Shoulder Internal Rotation --   L5   Right Shoulder External  Rotation --   C5     PROM   PROM Assessment Site Shoulder    Right/Left Shoulder Right    Right Shoulder Flexion 150 Degrees    Right Shoulder ABduction 95 Degrees    Right Shoulder Internal Rotation 55 Degrees    Right Shoulder External Rotation 40 Degrees      Strength   Overall Strength Comments elbow and grip strenght 5/5 MMT    Strength Assessment Site Shoulder    Right/Left Shoulder Right    Right Shoulder Flexion 4/5    Right Shoulder ABduction 4/5    Right Shoulder Internal Rotation 5/5    Right Shoulder External Rotation 4+/5                      Objective measurements completed on examination: See above findings.       [facility] Adult PT Treatment/Exercise - 12/23/19 0001      Exercises   Exercises Shoulder      Shoulder Exercises: Supine   External Rotation AAROM;10 reps;Right    Flexion AAROM;Right;10 reps      Shoulder Exercises: Seated   Other Seated Exercises table slide abd and table slide ER stretching X 10 reps ea on Rt      Shoulder Exercises: Standing   Row Both;10  reps    Theraband Level (Shoulder Row) Level 2 (Red)      Manual Therapy   Manual therapy comments Rt shoulder PROM gentle to tolerance all planes                  PT Education - 12/23/19 1153    Education Details HEP, POC,    Person(s) Educated Patient    Methods Explanation;Demonstration;Verbal cues;Handout    Comprehension Verbalized understanding;Returned demonstration            PT Short Term Goals - 12/23/19 1159      PT SHORT TERM GOAL #1   Title Pt will be I and compliant with HEP.    Time 4    Period Weeks    Status New    Target Date 01/20/20             PT Long Term Goals - 12/23/19 1159      PT LONG TERM GOAL #1   Title She will improve Rt shoulder ROM to [facility]    Time 8    Period Weeks    Status New    Target Date 02/17/20      PT LONG TERM GOAL #2   Title She will improve Rt shoulder strength to overall 4+    Time 8    Period Weeks    Status New      PT LONG TERM GOAL #3   Title Pt will report decreaesed pain in Rt shoulder no more than 3/10 with ususal activity and sleeping    Time 8    Period Weeks    Status New                  Plan - 12/23/19 1154    Clinical Impression Statement Pt presents with Rt shoulder pain, weakness, and stiffness S/P Right prox humerus fracture due to fall 07/16/19. This was treated conservatively. She will benefit from skilled PT to address her functional deficits.    Personal Factors and Comorbidities Comorbidity 2    Comorbidities DM, HTN    Examination-Activity Limitations Carry;Lift;Sleep;Reach Overhead    Examination-Participation Restrictions Driving;Cleaning;Laundry  Stability/Clinical Decision Making Evolving/Moderate complexity    Clinical Decision Making Moderate    Rehab Potential Good    PT Frequency 1x / week   1-2 per week   PT Duration 8 weeks    PT Treatment/Interventions ADLs/Self Care Home Management;Cryotherapy;Electrical Stimulation;Iontophoresis 4mg /ml Dexamethasone;Moist  Heat;Ultrasound;Therapeutic activities;Therapeutic exercise;Neuromuscular re-education;Manual techniques;Passive range of motion;Dry needling;Joint Manipulations;Vasopneumatic Device;Taping    PT Next Visit Plan needs Rt shoulder strength and ROM with gradual progression, use manual and modalties PRN    Consulted and Agree with Plan of Care Patient           Patient will benefit from skilled therapeutic intervention in order to improve the following deficits and impairments:  Decreased activity tolerance, Decreased mobility, Decreased range of motion, Decreased strength, Hypomobility, Postural dysfunction, Impaired flexibility, Pain, Increased muscle spasms, Increased fascial restricitons, Impaired UE functional use  Visit Diagnosis: Acute pain of right shoulder  Stiffness of right shoulder, not elsewhere classified  Muscle weakness (generalized)  Localized edema     Problem List There are no problems to display for this patient.   12/23/2019, 12:01 PM  [facility] Physical Therapy [address] [place], [place], [place] Phone: [phone]   Fax:  [phone]  Name: Laura Skinner MRN: [name] Date of Birth: [birth year]

## 2020-01-06 ENCOUNTER — Encounter: Payer: Self-pay | Admitting: Physical Therapy

## 2020-01-06 ENCOUNTER — Ambulatory Visit: Payer: Medicare Other | Admitting: Physical Therapy

## 2020-01-06 ENCOUNTER — Other Ambulatory Visit: Payer: Self-pay

## 2020-01-06 DIAGNOSIS — M25611 Stiffness of right shoulder, not elsewhere classified: Secondary | ICD-10-CM | POA: Diagnosis not present

## 2020-01-06 DIAGNOSIS — M25511 Pain in right shoulder: Secondary | ICD-10-CM | POA: Diagnosis not present

## 2020-01-06 DIAGNOSIS — R600 Localized edema: Secondary | ICD-10-CM

## 2020-01-06 DIAGNOSIS — M6281 Muscle weakness (generalized): Secondary | ICD-10-CM | POA: Diagnosis not present

## 2020-01-06 NOTE — Patient Instructions (Signed)
Access Code: [email or web address] URL: [email or web address] Date: 01/06/2020 Prepared by: Moshe Cipro  Exercises Supine Shoulder Flexion Extension AAROM with Dowel - 3 x daily - 7 x weekly - 10 reps - 1 sets Supine Shoulder External Rotation in 45 Degrees Abduction AAROM with Dowel - 3 x daily - 7 x weekly - 10 reps - 1 sets Shoulder Scaption AAROM with Dowel - 3 x daily - 7 x weekly - 10 reps - 1 sets Seated Shoulder Abduction Towel Slide at Table Top - 2 x daily - 6 x weekly - 10 reps - 1-2 sets - 5 hold Seated Shoulder External Rotation PROM on Table - 2-3 x daily - 6 x weekly - 10 reps - 1-2 sets - 5 hold Standing Row with Anchored Resistance - 2 x daily - 6 x weekly - 10-20 reps - 2-3 sets Standing Shoulder Extension with Dowel - 1 x daily - 7 x weekly - 1-2 sets - 10 reps Standing Bilateral Shoulder Internal Rotation AAROM with Dowel - 1 x daily - 7 x weekly - 1-2 sets - 10 reps Standing Shoulder Internal Rotation AAROM with Dowel - 1 x daily - 7 x weekly - 1-2 sets - 10 reps

## 2020-01-06 NOTE — Therapy (Signed)
[facility] Physical Therapy [address] [place], [place], [address] Phone: [phone]   Fax:  [phone]  Physical Therapy Treatment  Patient Details  Name: Laura Skinner MRN: [id number] Date of Birth: 02-Feb-1946 Referring Provider (PT): Persons, [place], [place]   Encounter Date: 01/06/2020   PT End of Session - 01/06/20 0925    Visit Number 2    Number of Visits 12    Date for PT Re-Evaluation 02/17/20    Authorization Type UHC    PT Start Time 0845    PT Stop Time 0925    PT Time Calculation (min) 40 min    Activity Tolerance Patient tolerated treatment well    Behavior During Therapy [facility] for tasks assessed/performed           History reviewed. No pertinent past medical history.  Past Surgical History:  Procedure Laterality Date  . BREAST EXCISIONAL BIOPSY Left     There were no vitals filed for this visit.   Subjective Assessment - 01/06/20 0838    Subjective doing okay - exercises going well    Pertinent History DM, HTN    Limitations House hold activities;Lifting    Patient Stated Goals reduce pain    Currently in Pain? No/denies    Pain Onset --                             [facility] Adult PT Treatment/Exercise - 01/06/20 0849      Shoulder Exercises: Standing   Extension AAROM;20 reps   1# bar   Row Both;20 reps    Theraband Level (Shoulder Row) Level 3 (Green)    Other Standing Exercises IR with cane (up back and across back) x 20 reps each with 1# bar      Shoulder Exercises: Pulleys   Flexion 3 minutes    Scaption 3 minutes      Manual Therapy   Manual therapy comments Rt shoulder PROM gentle to tolerance all planes                  PT Education - 01/06/20 0925    Education Details added to HEP - did not print, will give next visit    Person(s) Educated Patient    Methods Explanation    Comprehension Verbalized understanding            PT Short Term Goals - 12/23/19 1159      PT SHORT TERM  GOAL #1   Title Pt will be I and compliant with HEP.    Time 4    Period Weeks    Status New    Target Date 01/20/20             PT Long Term Goals - 12/23/19 1159      PT LONG TERM GOAL #1   Title She will improve Rt shoulder ROM to [facility]    Time 8    Period Weeks    Status New    Target Date 02/17/20      PT LONG TERM GOAL #2   Title She will improve Rt shoulder strength to overall 4+    Time 8    Period Weeks    Status New      PT LONG TERM GOAL #3   Title Pt will report decreaesed pain in Rt shoulder no more than 3/10 with ususal activity and sleeping    Time 8    Period  Weeks    Status New                 Plan - 01/06/20 0926    Clinical Impression Statement Pt tolerated session well today without significant increase in pain, continuing to work on regaining ROM.  Will continue to benefit from PT to maximize function.    Personal Factors and Comorbidities Comorbidity 2    Comorbidities DM, HTN    Examination-Activity Limitations Carry;Lift;Sleep;Reach Overhead    Examination-Participation Restrictions Driving;Cleaning;Laundry    Stability/Clinical Decision Making Evolving/Moderate complexity    Rehab Potential Good    PT Frequency 1x / week   1-2 per week   PT Duration 8 weeks    PT Treatment/Interventions ADLs/Self Care Home Management;Cryotherapy;Electrical Stimulation;Iontophoresis 4mg /ml Dexamethasone;Moist Heat;Ultrasound;Therapeutic activities;Therapeutic exercise;Neuromuscular re-education;Manual techniques;Passive range of motion;Dry needling;Joint Manipulations;Vasopneumatic Device;Taping    PT Next Visit Plan needs Rt shoulder strength and ROM with gradual progression, use manual and modalties PRN    PT Home Exercise Plan Access Code: [email or web address]    Consulted and Agree with Plan of Care Patient           Patient will benefit from skilled therapeutic intervention in order to improve the following deficits and impairments:  Decreased activity  tolerance, Decreased mobility, Decreased range of motion, Decreased strength, Hypomobility, Postural dysfunction, Impaired flexibility, Pain, Increased muscle spasms, Increased fascial restricitons, Impaired UE functional use  Visit Diagnosis: Acute pain of right shoulder  Stiffness of right shoulder, not elsewhere classified  Muscle weakness (generalized)  Localized edema     Problem List There are no problems to display for this patient.     , PT, DPT 01/06/20 9:27 AM     [facility] Physical Therapy [address] [place], [place], [place] Phone: [phone]   Fax:  [phone]  Name: Glyn Zendejas MRN: [name] Date of Birth: Aug 27, 1945

## 2020-01-12 ENCOUNTER — Encounter: Payer: Medicare Other | Admitting: Physical Therapy

## 2020-01-13 ENCOUNTER — Encounter: Payer: Medicare Other | Admitting: Physical Therapy

## 2020-01-19 ENCOUNTER — Ambulatory Visit
Admission: RE | Admit: 2020-01-19 | Discharge: 2020-01-19 | Disposition: A | Discharge status: Home / Self Care | Payer: Medicare Other | Source: Ambulatory Visit | Attending: Physician Assistant | Admitting: Physician Assistant

## 2020-01-19 ENCOUNTER — Other Ambulatory Visit: Payer: Self-pay

## 2020-01-19 DIAGNOSIS — Z13820 Encounter for screening for osteoporosis: Secondary | ICD-10-CM

## 2020-01-20 ENCOUNTER — Encounter: Payer: Self-pay | Admitting: Physical Therapy

## 2020-01-20 ENCOUNTER — Encounter: Payer: Medicare Other | Admitting: Physical Therapy

## 2020-01-20 ENCOUNTER — Ambulatory Visit (INDEPENDENT_AMBULATORY_CARE_PROVIDER_SITE_OTHER): Payer: Medicare Other | Admitting: Physical Therapy

## 2020-01-20 DIAGNOSIS — M6281 Muscle weakness (generalized): Secondary | ICD-10-CM

## 2020-01-20 DIAGNOSIS — R600 Localized edema: Secondary | ICD-10-CM | POA: Diagnosis not present

## 2020-01-20 DIAGNOSIS — M25511 Pain in right shoulder: Secondary | ICD-10-CM | POA: Diagnosis not present

## 2020-01-20 DIAGNOSIS — M25611 Stiffness of right shoulder, not elsewhere classified: Secondary | ICD-10-CM | POA: Diagnosis not present

## 2020-01-20 NOTE — Therapy (Signed)
[facility] Physical Therapy [address] [place], [place], [address] Phone: [phone]   Fax:  [phone]  Physical Therapy Treatment  Patient Details  Name: Laura Skinner MRN: [id number] Date of Birth: [birth year] Referring Provider (PT): Persons, [place], [place]   Encounter Date: 01/20/2020   PT End of Session - 01/20/20 1431    Visit Number 3    Number of Visits 12    Date for PT Re-Evaluation 02/17/20    Authorization Type UHC    PT Start Time 1340    PT Stop Time 1420    PT Time Calculation (min) 40 min    Activity Tolerance Patient tolerated treatment well    Behavior During Therapy [facility] for tasks assessed/performed           History reviewed. No pertinent past medical history.  Past Surgical History:  Procedure Laterality Date  . BREAST EXCISIONAL BIOPSY Left     There were no vitals filed for this visit.   Subjective Assessment - 01/20/20 1336    Subjective shoulder is going well    Pertinent History DM, HTN    Limitations House hold activities;Lifting    Patient Stated Goals reduce pain    Currently in Pain? Yes    Pain Score 4     Pain Location Shoulder    Pain Orientation Right    Pain Descriptors / Indicators Aching    Pain Radiating Towards Rt forearm    Pain Onset More than a month ago    Pain Frequency Intermittent    Aggravating Factors  reaching behind back or head, reaching up and out, lifting, carrying    Pain Relieving Factors ice, heat              [facility] PT Assessment - 01/20/20 1355      AROM   Right Shoulder Flexion 133 Degrees   114 sitting with shrug                        [facility] Adult PT Treatment/Exercise - 01/20/20 1340      Shoulder Exercises: Supine   Flexion AROM;Right;20 reps      Shoulder Exercises: Sidelying   ABduction Right;20 reps;AROM    ABduction Limitations scaption      Shoulder Exercises: Standing   Flexion Limitations to 2nd shelf in cabinet with 1# DB x 20 reps; Rt     Row Both;20 reps    Theraband Level (Shoulder Row) Level 3 (Green)    Other Standing Exercises IR with cane (up back and across back) x 20 reps each with 1# bar    Other Standing Exercises wall ladder x 10 reps flexion      Shoulder Exercises: Pulleys   Flexion 3 minutes    Scaption 3 minutes      Shoulder Exercises: ROM/Strengthening   [place] flexion/scaption on Rt x 20 reps      Manual Therapy   Manual therapy comments Rt shoulder PROM gentle to tolerance all planes; gentle grades 2/3 A/P and inf mobs; scapular mobs                    PT Short Term Goals - 01/20/20 1431      PT SHORT TERM GOAL #1   Title Pt will be I and compliant with HEP.    Time 4    Period Weeks    Status Achieved    Target Date 01/20/20  PT Long Term Goals - 12/23/19 1159      PT LONG TERM GOAL #1   Title She will improve Rt shoulder ROM to [facility]    Time 8    Period Weeks    Status New    Target Date 02/17/20      PT LONG TERM GOAL #2   Title She will improve Rt shoulder strength to overall 4+    Time 8    Period Weeks    Status New      PT LONG TERM GOAL #3   Title Pt will report decreaesed pain in Rt shoulder no more than 3/10 with ususal activity and sleeping    Time 8    Period Weeks    Status New                 Plan - 01/20/20 1431    Clinical Impression Statement Pt is independent with current HEP and overall demonstrating improvement in ROM and functional activity with RUE.  Still has some ROM limitations and strength deficits and will continue to benefit from PT to maximize function.    Personal Factors and Comorbidities Comorbidity 2    Comorbidities DM, HTN    Examination-Activity Limitations Carry;Lift;Sleep;Reach Overhead    Examination-Participation Restrictions Driving;Cleaning;Laundry    Stability/Clinical Decision Making Evolving/Moderate complexity    Rehab Potential Good    PT Frequency 1x / week   1-2 per week   PT Duration 8 weeks     PT Treatment/Interventions ADLs/Self Care Home Management;Cryotherapy;Electrical Stimulation;Iontophoresis 4mg /ml Dexamethasone;Moist Heat;Ultrasound;Therapeutic activities;Therapeutic exercise;Neuromuscular re-education;Manual techniques;Passive range of motion;Dry needling;Joint Manipulations;Vasopneumatic Device;Taping    PT Next Visit Plan needs Rt shoulder strength and ROM with gradual progression, use manual and modalties PRN    PT Home Exercise Plan Access Code: [email or web address]    Consulted and Agree with Plan of Care Patient           Patient will benefit from skilled therapeutic intervention in order to improve the following deficits and impairments:  Decreased activity tolerance, Decreased mobility, Decreased range of motion, Decreased strength, Hypomobility, Postural dysfunction, Impaired flexibility, Pain, Increased muscle spasms, Increased fascial restricitons, Impaired UE functional use  Visit Diagnosis: Acute pain of right shoulder  Stiffness of right shoulder, not elsewhere classified  Muscle weakness (generalized)  Localized edema     Problem List There are no problems to display for this patient.     , PT, DPT 01/20/20 2:33 PM     [facility] Physical Therapy [address] [place], [place], [place] Phone: [phone]   Fax:  [phone]  Name: Laura Skinner MRN: [name] Date of Birth: 03-Jun-1945

## 2020-01-27 ENCOUNTER — Encounter: Payer: Medicare Other | Admitting: Physical Therapy

## 2020-01-27 ENCOUNTER — Encounter: Payer: Self-pay | Admitting: Physical Therapy

## 2020-01-27 ENCOUNTER — Other Ambulatory Visit: Payer: Self-pay

## 2020-01-27 ENCOUNTER — Ambulatory Visit: Payer: Medicare Other | Admitting: Physical Therapy

## 2020-01-27 DIAGNOSIS — M25511 Pain in right shoulder: Secondary | ICD-10-CM | POA: Diagnosis not present

## 2020-01-27 DIAGNOSIS — M25611 Stiffness of right shoulder, not elsewhere classified: Secondary | ICD-10-CM | POA: Diagnosis not present

## 2020-01-27 DIAGNOSIS — R600 Localized edema: Secondary | ICD-10-CM | POA: Diagnosis not present

## 2020-01-27 DIAGNOSIS — M6281 Muscle weakness (generalized): Secondary | ICD-10-CM

## 2020-01-27 NOTE — Patient Instructions (Signed)
Access Code: [email or web address] URL: [email or web address] Date: 01/27/2020 Prepared by: Moshe Cipro  Exercises Supine Shoulder Flexion Extension AAROM with Dowel - 3 x daily - 7 x weekly - 10 reps - 1 sets Supine Shoulder External Rotation in 45 Degrees Abduction AAROM with Dowel - 3 x daily - 7 x weekly - 10 reps - 1 sets Shoulder Scaption AAROM with Dowel - 3 x daily - 7 x weekly - 10 reps - 1 sets Seated Shoulder Abduction Towel Slide at Table Top - 2 x daily - 6 x weekly - 10 reps - 1-2 sets - 5 hold Seated Shoulder External Rotation PROM on Table - 2-3 x daily - 6 x weekly - 10 reps - 1-2 sets - 5 hold Standing Row with Anchored Resistance - 2 x daily - 6 x weekly - 10-20 reps - 2-3 sets Standing Shoulder Extension with Dowel - 1 x daily - 7 x weekly - 1-2 sets - 10 reps Standing Bilateral Shoulder Internal Rotation AAROM with Dowel - 1 x daily - 7 x weekly - 1-2 sets - 10 reps Standing Shoulder Internal Rotation AAROM with Dowel - 1 x daily - 7 x weekly - 1-2 sets - 10 reps Standing Shoulder Internal Rotation Stretch with Towel - 2 x daily - 7 x weekly - 10 reps - 1 sets - 10 sec hold

## 2020-01-27 NOTE — Therapy (Signed)
[facility] [address] [place], [place], [address] Phone: [phone]   Fax:  [phone]  Physical Therapy Treatment  Patient Details  Name: Laura Skinner MRN: [id number] Date of Birth: 12-05-1945 Referring Provider (PT): Skinner, Laura Bali, Laura Skinner   Encounter Date: 01/27/2020   PT End of Session - 01/27/20 1252    Visit Number 4    Number of Visits 12    Date for PT Re-Evaluation 02/17/20    Authorization Type UHC    PT Start Time 1140    PT Stop Time 1223    PT Time Calculation (min) 43 min    Activity Tolerance Patient tolerated treatment well    Behavior During Therapy [facility] for tasks assessed/performed           History reviewed. No pertinent past medical history.  Past Surgical History:  Procedure Laterality Date  . BREAST EXCISIONAL BIOPSY Left     There were no vitals filed for this visit.   Subjective Assessment - 01/27/20 1143    Subjective continuing to work on motion and strength, no issues to report    Pertinent History DM, HTN    Limitations House hold activities;Lifting    Patient Stated Goals reduce pain    Currently in Pain? No/denies    Pain Onset More than a month ago              [facility] PT Assessment - 01/27/20 1212      AROM   Right Shoulder Flexion 138 Degrees   sitting                        [facility] Adult PT Treatment/Exercise - 01/27/20 0001      Shoulder Exercises: Supine   Flexion Right   3x10   Shoulder Flexion Weight (lbs) 1      Shoulder Exercises: Sidelying   External Rotation Right   3x10   External Rotation Weight (lbs) 1    ABduction Right   3x10   ABduction Weight (lbs) 1    ABduction Limitations scaption      Shoulder Exercises: Standing   Extension AAROM;20 reps   1# bar   Other Standing Exercises IR with cane (up back and across back) x 20 reps each with 1# bar    Other Standing Exercises wall ladder x 10 reps flexion and scaption; 1.5# wrist weight      Shoulder  Exercises: Pulleys   Flexion 3 minutes    Scaption 3 minutes      Shoulder Exercises: Stretch   Internal Rotation Stretch 3 reps   15 sec     Manual Therapy   Manual therapy comments Rt shoulder PROM gentle to tolerance all planes; gentle grades 2/3 A/P and inf mobs; scapular mobs                  PT Education - 01/27/20 1252    Education Details internal rotation stretch    Person(s) Educated Patient    Methods Explanation;Demonstration;Handout    Comprehension Verbalized understanding;Returned demonstration;Need further instruction            PT Short Term Goals - 01/20/20 1431      PT SHORT TERM GOAL #1   Title Pt will be I and compliant with HEP.    Time 4    Period Weeks    Status Achieved    Target Date 01/20/20  PT Long Term Goals - 12/23/19 1159      PT LONG TERM GOAL #1   Title She will improve Rt shoulder ROM to [facility]    Time 8    Period Weeks    Status New    Target Date 02/17/20      PT LONG TERM GOAL #2   Title She will improve Rt shoulder strength to overall 4+    Time 8    Period Weeks    Status New      PT LONG TERM GOAL #3   Title Pt will report decreaesed pain in Rt shoulder no more than 3/10 with ususal activity and sleeping    Time 8    Period Weeks    Status New                 Plan - 01/27/20 1253    Clinical Impression Statement Pt continues to demonstrate steady improvements in ROM and function.  She still has some ROM and strength limitations and will continue to benefit from PT to maximize function.  May request holding or d/c next week as she is heading to [facility] for ~ 1 month in November.  Will plan to reassess and determine next steps.    Personal Factors and Comorbidities Comorbidity 2    Comorbidities DM, HTN    Examination-Activity Limitations Carry;Lift;Sleep;Reach Overhead    Examination-Participation Restrictions Driving;Cleaning;Laundry    Stability/Clinical Decision Making Evolving/Moderate  complexity    Rehab Potential Good    PT Frequency 1x / week   1-2 per week   PT Duration 8 weeks    PT Treatment/Interventions ADLs/Self Care Home Management;Cryotherapy;Electrical Stimulation;Iontophoresis 4mg /ml Dexamethasone;Moist Heat;Ultrasound;Therapeutic activities;Therapeutic exercise;Neuromuscular re-education;Manual techniques;Passive range of motion;Dry needling;Joint Manipulations;Vasopneumatic Device;Taping    PT Next Visit Plan last scheduled appt - may want to hold or continue    PT Home Exercise Plan Access Code: [email or web address]    Consulted and Agree with Plan of Care Patient           Patient will benefit from skilled therapeutic intervention in order to improve the following deficits and impairments:  Decreased activity tolerance, Decreased mobility, Decreased range of motion, Decreased strength, Hypomobility, Postural dysfunction, Impaired flexibility, Pain, Increased muscle spasms, Increased fascial restricitons, Impaired UE functional use  Visit Diagnosis: Acute pain of right shoulder  Stiffness of right shoulder, not elsewhere classified  Muscle weakness (generalized)  Localized edema     Problem List There are no problems to display for this patient.     , PT, DPT 01/27/20 12:55 PM     [facility] [address] [place], [place], [place] Phone: [phone]   Fax:  [phone]  Name: Laura Skinner MRN: [name] Date of Birth: 03-27-1946

## 2020-02-03 ENCOUNTER — Other Ambulatory Visit: Payer: Self-pay

## 2020-02-03 ENCOUNTER — Encounter: Payer: Self-pay | Admitting: Physical Therapy

## 2020-02-03 ENCOUNTER — Ambulatory Visit: Payer: Medicare Other | Admitting: Physical Therapy

## 2020-02-03 ENCOUNTER — Encounter: Payer: Medicare Other | Admitting: Physical Therapy

## 2020-02-03 DIAGNOSIS — R600 Localized edema: Secondary | ICD-10-CM

## 2020-02-03 DIAGNOSIS — M6281 Muscle weakness (generalized): Secondary | ICD-10-CM | POA: Diagnosis not present

## 2020-02-03 DIAGNOSIS — M25511 Pain in right shoulder: Secondary | ICD-10-CM

## 2020-02-03 DIAGNOSIS — M25611 Stiffness of right shoulder, not elsewhere classified: Secondary | ICD-10-CM | POA: Diagnosis not present

## 2020-02-03 NOTE — Therapy (Signed)
[facility] [address] [place], [place], [address] Phone: [phone]   Fax:  [phone]  Physical Therapy Treatment/Discharge Summary  Patient Details  Name: Laura Skinner MRN: [id number] Date of Birth: 11/20/1945 Referring Provider (PT): Persons, Bevely Palmer, [place]   Encounter Date: 02/03/2020   PT End of Session - 02/03/20 1245    Visit Number 5    Number of Visits 12    Date for PT Re-Evaluation 02/17/20    Authorization Type UHC    PT Start Time 3491    PT Stop Time 1219    PT Time Calculation (min) 38 min    Activity Tolerance Patient tolerated treatment well    Behavior During Therapy [facility] for tasks assessed/performed           History reviewed. No pertinent past medical history.  Past Surgical History:  Procedure Laterality Date   BREAST EXCISIONAL BIOPSY Left     There were no vitals filed for this visit.   Subjective Assessment - 02/03/20 1141    Subjective having some mid back pain today - started yesterday but doesn't know how she hurt it, "maybe sleeping"    Pertinent History DM, HTN    Limitations House hold activities;Lifting    Patient Stated Goals reduce pain    Currently in Pain? No/denies    Pain Score 8    unrelated to referral   Pain Location Back    Pain Orientation Mid    Pain Descriptors / Indicators Aching    Pain Onset Yesterday    Pain Frequency Constant              [facility] PT Assessment - 02/03/20 1207      Assessment   Medical Diagnosis S/P Right prox humerus fracture due to fall 07/16/19    Referring Provider (PT) Persons, Bevely Palmer, [place]      AROM   Right Shoulder Flexion 141 Degrees   sitting   Right Shoulder ABduction 100 Degrees   sitting   Right Shoulder Internal Rotation --   L2/3   Right Shoulder External Rotation --   FER to C7     Strength   Right Shoulder Flexion 5/5    Right Shoulder ABduction 5/5   within available range   Right Shoulder Internal Rotation 5/5    Right Shoulder  External Rotation 4+/5                         [facility] Adult PT Treatment/Exercise - 02/03/20 1145      Shoulder Exercises: Standing   Extension AAROM;20 reps   1# bar   Other Standing Exercises IR with cane (up back and across back) x 20 reps each with 1# bar      Shoulder Exercises: Pulleys   Flexion 3 minutes    Scaption 3 minutes      Manual Therapy   Manual therapy comments Rt shoulder PROM gentle to tolerance all planes; gentle grades 2/3 A/P and inf mobs; scapular mobs                    PT Short Term Goals - 01/20/20 1431      PT SHORT TERM GOAL #1   Title Pt will be I and compliant with HEP.    Time 4    Period Weeks    Status Achieved    Target Date 01/20/20  PT Long Term Goals - 02/03/20 1245      PT LONG TERM GOAL #1   Title She will improve Rt shoulder ROM to [facility]    Time 8    Period Weeks    Status Partially Met      PT LONG TERM GOAL #2   Title She will improve Rt shoulder strength to overall 4+    Time 8    Period Weeks    Status Achieved      PT LONG TERM GOAL #3   Title Pt will report decreaesed pain in Rt shoulder no more than 3/10 with ususal activity and sleeping    Time 8    Period Weeks    Status Partially Met                 Plan - 02/03/20 1246    Clinical Impression Statement Pt is heading to [facility] for ~ 1 month and therefore planning for d/c today.  One LTG met at this time, and she has demonstrated great progress towards her ROM and pain overall.  Has extensive HEP to address deficits and recommended to reach out to referring provider when she returns if she would like to return to PT.    Personal Factors and Comorbidities Comorbidity 2    Comorbidities DM, HTN    Examination-Activity Limitations Carry;Lift;Sleep;Reach Overhead    Examination-Participation Restrictions Driving;Cleaning;Laundry    Stability/Clinical Decision Making Evolving/Moderate complexity    Rehab Potential Good    PT  Frequency 1x / week   1-2 per week   PT Duration 8 weeks    PT Treatment/Interventions ADLs/Self Care Home Management;Cryotherapy;Electrical Stimulation;Iontophoresis 1m/ml Dexamethasone;Moist Heat;Ultrasound;Therapeutic activities;Therapeutic exercise;Neuromuscular re-education;Manual techniques;Passive range of motion;Dry needling;Joint Manipulations;Vasopneumatic Device;Taping    PT Next Visit Plan d/c PT today    PT Home Exercise Plan Access Code: [email or web address]    Consulted and Agree with Plan of Care Patient           Patient will benefit from skilled therapeutic intervention in order to improve the following deficits and impairments:  Decreased activity tolerance, Decreased mobility, Decreased range of motion, Decreased strength, Hypomobility, Postural dysfunction, Impaired flexibility, Pain, Increased muscle spasms, Increased fascial restricitons, Impaired UE functional use  Visit Diagnosis: Acute pain of right shoulder  Stiffness of right shoulder, not elsewhere classified  Muscle weakness (generalized)  Localized edema     Problem List There are no problems to display for this patient.     Laura Skinner PT, DPT 02/03/20 12:49 PM    [facility] [address] [address]Phone: [phone]  Fax:  [phone] Name: Laura HardingMRN: [id number] of Birth: 219-Jul-1947     PHYSICAL THERAPY DISCHARGE SUMMARY  Visits from Start of Care: 5  Current functional level related to goals / functional outcomes: See above   Remaining deficits: See above   Education / Equipment: HEP  Plan: Patient agrees to discharge.  Patient goals were partially met. Patient is being discharged due to the patient's request.  ?????    Pt going to [place] x 1 month, advised to request new referral if needed upon return.  Laura Skinner PT, DPT 02/03/20 12:50 PM  [facility] [address] [address]Phone: [phone]  Fax:  [phone]

## 2020-04-01 ENCOUNTER — Other Ambulatory Visit: Payer: Self-pay

## 2020-04-01 DIAGNOSIS — S42291A Other displaced fracture of upper end of right humerus, initial encounter for closed fracture: Secondary | ICD-10-CM

## 2020-04-01 DIAGNOSIS — G8929 Other chronic pain: Secondary | ICD-10-CM

## 2020-04-06 ENCOUNTER — Other Ambulatory Visit: Payer: Self-pay

## 2020-04-06 ENCOUNTER — Encounter: Payer: Self-pay | Admitting: Physical Therapy

## 2020-04-06 ENCOUNTER — Ambulatory Visit: Payer: Medicare Other | Admitting: Physical Therapy

## 2020-04-06 DIAGNOSIS — G8929 Other chronic pain: Secondary | ICD-10-CM | POA: Diagnosis not present

## 2020-04-06 DIAGNOSIS — M5441 Lumbago with sciatica, right side: Secondary | ICD-10-CM | POA: Diagnosis not present

## 2020-04-06 DIAGNOSIS — M6281 Muscle weakness (generalized): Secondary | ICD-10-CM | POA: Diagnosis not present

## 2020-04-06 DIAGNOSIS — M25611 Stiffness of right shoulder, not elsewhere classified: Secondary | ICD-10-CM | POA: Diagnosis not present

## 2020-04-06 NOTE — Therapy (Addendum)
[facility] [address] [place], [place], [address] Phone: [phone]   Fax:  [phone]  Physical Therapy Evaluation/Discharge addendum PHYSICAL THERAPY DISCHARGE SUMMARY  Visits from Start of Care: 1  Current functional level related to goals / functional outcomes: See below   Remaining deficits: See below   Education / Equipment: HEP  Plan: Patient agrees to discharge.  Patient goals were not met. Patient is being discharged due to not returning since the last visit.  ?????  Elsie Ra, PT, DPT 05/25/20 2:56 PM      Patient Details  Name: Laura Skinner MRN: [id number] Date of Birth: November 27, 1945 Referring Provider (PT): Persons, Stanton Kidney   Encounter Date: 04/06/2020   PT End of Session - 04/06/20 1113    Visit Number 1    Number of Visits 12    Date for PT Re-Evaluation 06/01/20    Authorization Type UHC MCR    PT Start Time 9528    PT Stop Time 1102    PT Time Calculation (min) 47 min    Activity Tolerance Patient tolerated treatment well    Behavior During Therapy [facility] for tasks assessed/performed           History reviewed. No pertinent past medical history.  Past Surgical History:  Procedure Laterality Date  . BREAST EXCISIONAL BIOPSY Left     There were no vitals filed for this visit.    Subjective Assessment - 04/06/20 1022    Subjective her chief complaint is for her chronic LBP that is Rt sided and runs down her Rt leg to her knee. 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 There is no imaging in her chart but she relays she had some years ago in [place] and was being followed by MD there who told her she had sciatica and disc problems. She denies any redflag symptoms.    Pertinent History Rt shoulder fx in april.    Currently in Pain? 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  No    Is the patient reluctant to leave their home because of a fear of falling?  No      Home Ecologist residence  Prior Function   Level of Independence Independent    Vocation Retired    Leisure walking      Cognition   Overall Cognitive Status Within Functional Limits for tasks assessed      ROM / Strength   AROM / PROM / Strength AROM;Strength      AROM   AROM Assessment Site Lumbar;Shoulder    Right/Left Shoulder Right    Right Shoulder Flexion 160 Degrees    Right Shoulder ABduction 120 Degrees    Lumbar Flexion WNL  and Rt sided pain    Lumbar Extension 50% and some centralization with repeated extensions in standing    Lumbar - Right Side Bend 50%    Lumbar - Left Side Bend 50%    Lumbar - Right Rotation 50%    Lumbar - Left Rotation 50%      Strength   Overall Strength Comments Rt leg strength overall 4+, Lt 5/5 grossly tested in sitting      Palpation   Palpation comment TTP Rt lumbar P.S, SIJ, glutes/piriformis      Special Tests   Other special tests + SLR test on Rt that was negative on Lt, negative slump test, +  SIJ testing on Rt, possible LLD 1/8 inch with Rt leg shorter      Ambulation/Gait   Gait Comments mild LLD? 1/8 inch provided her with shoe insert to try for this                      Objective measurements completed on examination: See above findings.       [facility] Adult PT Treatment/Exercise - 04/06/20 0001      Modalities   Modalities Electrical Stimulation;Moist Heat      Moist Heat Therapy   Number Minutes Moist Heat 10 Minutes    Moist Heat Location Lumbar Spine      Electrical Stimulation   Electrical Stimulation Location Rt sided lumbar    Electrical Stimulation Action IFC 10 min with heat    Electrical Stimulation Parameters tolerance    Electrical Stimulation Goals Pain                  PT Education - 04/06/20 1113    Education Details HEP for lumbar, exam finding, TENS    Person(s) Educated Patient    Methods Explanation;Demonstration;Verbal cues;Handout    Comprehension Verbalized understanding            PT Short Term Goals - 04/06/20 1125      PT SHORT TERM GOAL #1   Title Pt will be I and compliant with HEP.    Time 4    Period Weeks    Status New    Target Date 05/04/20             PT Long Term Goals - 04/06/20 1124      PT LONG TERM GOAL #1   Title She will improve lumbar and  Rt shoulder ROM to [facility]    Time 8    Period Weeks    Status New    Target Date 06/01/20      PT LONG TERM GOAL #2   Title She will reduce overall back pain to 4 or less with decreased radiculopaty symptoms into Rt leg with usuall activity and standing one hour    Baseline 9    Time 8    Period Weeks    Status New      PT  LONG TERM GOAL #3   Title She weill improve Rt leg strength to 5/5 MMT grossly tested in sitting    Time 8    Period Weeks    Status New                  Plan - 04/06/20 1115    Clinical Impression Statement She has new paper MD referral from [place] [facility] for lumbago. 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 This is likely from disc bulge as she had good response to [facility] extension program and she had a + SLR. There are no red flag signs. She will benefit from skilled PT to address her functional deficits in lumbar/shoulder ROM, Rt leg strength, increased back pain with radiculopathy, and decreased activity tolerance for standing and ambulation.    Examination-Activity Limitations Bend;Squat;Stairs;Stand;Lift;Reach Overhead    Examination-Participation Restrictions Cleaning;Meal Prep;Community Activity;Shop;Laundry    Stability/Clinical Decision Making Stable/Uncomplicated    Clinical Decision Making Moderate    Rehab Potential Good    PT Frequency 2x / week   1-2   PT Duration 8 weeks    PT Treatment/Interventions Electrical Stimulation;Gait training;Ultrasound;Traction;Moist Heat;Iontophoresis 10m/ml Dexamethasone;Therapeutic activities;Therapeutic exercise;Balance training;Neuromuscular re-education;Manual techniques;Passive range of motion;Dry needling;Joint Manipulations;Spinal Manipulations;Taping    PT Home Exercise Plan Access Code: [email or web address]   Consulted and Agree with Plan of Care Patient           Patient will benefit from skilled therapeutic intervention in order to improve the following deficits and impairments:  Decreased activity tolerance,Decreased balance,Decreased range of motion,Decreased strength,Difficulty walking,Increased muscle spasms,Postural dysfunction,Impaired flexibility,Pain  Visit Diagnosis: Chronic right-sided low back pain with right-sided sciatica - Plan: PT plan of care cert/re-cert  Muscle weakness (generalized) - Plan: PT plan of care cert/re-cert  Stiffness of right shoulder, not elsewhere classified - Plan: PT plan of care cert/re-cert     Problem List There are no problems to display for this patient.   BSilvestre Mesi12/22/2021, 3:33 PM  [facility] [address] [address]Phone: [phone]  Fax:  [phone] Name: Laura SantiniMRN: [id number] of Birth: 213-Oct-1947

## 2020-04-06 NOTE — Patient Instructions (Signed)
Access Code: [email or web address] URL: [email or web address] Date: 04/06/2020 Prepared by: Ivery Quale  Exercises Standing Lumbar Extension at Wall - Forearms - 2 x daily - 6 x weekly - 3 sets - 10 reps Supine Lower Trunk Rotation - 2 x daily - 6 x weekly - 1 sets - 10 reps - 5 sec hold Supine Piriformis Stretch with Foot on Ground - 2 x daily - 6 x weekly - 3 sets - 30 hold Dead Bug - 2 x daily - 6 x weekly - 1-2 sets - 10 reps Supine Bridge - 2 x daily - 6 x weekly - 1-2 sets - 10 reps - 5 hold

## 2020-04-06 NOTE — Addendum Note (Signed)
Addended by: April Manson on: 04/06/2020 03:37 PM   Modules accepted: Orders

## 2020-04-21 ENCOUNTER — Encounter: Payer: Medicare Other | Admitting: Rehabilitative and Restorative Service Providers"

## 2020-04-29 ENCOUNTER — Encounter: Payer: Medicare Other | Admitting: Rehabilitative and Restorative Service Providers"

## 2020-06-29 DIAGNOSIS — K5900 Constipation, unspecified: Secondary | ICD-10-CM | POA: Diagnosis not present

## 2020-06-29 DIAGNOSIS — K602 Anal fissure, unspecified: Secondary | ICD-10-CM | POA: Diagnosis not present

## 2020-06-29 DIAGNOSIS — K649 Unspecified hemorrhoids: Secondary | ICD-10-CM | POA: Diagnosis not present

## 2020-07-12 DIAGNOSIS — E042 Nontoxic multinodular goiter: Secondary | ICD-10-CM | POA: Diagnosis not present

## 2020-07-12 DIAGNOSIS — E119 Type 2 diabetes mellitus without complications: Secondary | ICD-10-CM | POA: Diagnosis not present

## 2020-07-22 DIAGNOSIS — E041 Nontoxic single thyroid nodule: Secondary | ICD-10-CM | POA: Diagnosis not present

## 2020-07-22 DIAGNOSIS — E042 Nontoxic multinodular goiter: Secondary | ICD-10-CM | POA: Diagnosis not present

## 2020-08-09 DIAGNOSIS — Z800 Family history of malignant neoplasm of digestive organs: Secondary | ICD-10-CM | POA: Diagnosis not present

## 2020-08-09 DIAGNOSIS — Z86010 Personal history of colonic polyps: Secondary | ICD-10-CM | POA: Diagnosis not present

## 2020-08-09 DIAGNOSIS — K649 Unspecified hemorrhoids: Secondary | ICD-10-CM | POA: Diagnosis not present

## 2020-08-09 DIAGNOSIS — K5900 Constipation, unspecified: Secondary | ICD-10-CM | POA: Diagnosis not present

## 2020-08-19 DIAGNOSIS — E1169 Type 2 diabetes mellitus with other specified complication: Secondary | ICD-10-CM | POA: Diagnosis not present

## 2020-08-19 DIAGNOSIS — E785 Hyperlipidemia, unspecified: Secondary | ICD-10-CM | POA: Diagnosis not present

## 2020-08-19 DIAGNOSIS — I10 Essential (primary) hypertension: Secondary | ICD-10-CM | POA: Diagnosis not present

## 2020-08-22 DIAGNOSIS — I10 Essential (primary) hypertension: Secondary | ICD-10-CM | POA: Diagnosis not present

## 2020-09-09 DIAGNOSIS — R944 Abnormal results of kidney function studies: Secondary | ICD-10-CM | POA: Diagnosis not present

## 2020-10-03 ENCOUNTER — Other Ambulatory Visit: Payer: Self-pay | Admitting: Physician Assistant

## 2020-10-03 DIAGNOSIS — Z1231 Encounter for screening mammogram for malignant neoplasm of breast: Secondary | ICD-10-CM

## 2020-12-22 ENCOUNTER — Ambulatory Visit: Payer: Medicare Other

## 2020-12-30 ENCOUNTER — Other Ambulatory Visit: Payer: Self-pay

## 2020-12-30 ENCOUNTER — Ambulatory Visit
Admission: RE | Admit: 2020-12-30 | Discharge: 2020-12-30 | Disposition: A | Discharge status: Home / Self Care | Payer: Medicare Other | Source: Ambulatory Visit | Attending: Physician Assistant | Admitting: Physician Assistant

## 2020-12-30 DIAGNOSIS — Z1231 Encounter for screening mammogram for malignant neoplasm of breast: Secondary | ICD-10-CM | POA: Diagnosis not present

## 2021-01-02 DIAGNOSIS — H40013 Open angle with borderline findings, low risk, bilateral: Secondary | ICD-10-CM | POA: Diagnosis not present

## 2021-01-02 DIAGNOSIS — H524 Presbyopia: Secondary | ICD-10-CM | POA: Diagnosis not present

## 2021-01-02 DIAGNOSIS — E119 Type 2 diabetes mellitus without complications: Secondary | ICD-10-CM | POA: Diagnosis not present

## 2021-01-02 DIAGNOSIS — H35033 Hypertensive retinopathy, bilateral: Secondary | ICD-10-CM | POA: Diagnosis not present

## 2021-01-02 DIAGNOSIS — H2513 Age-related nuclear cataract, bilateral: Secondary | ICD-10-CM | POA: Diagnosis not present

## 2021-01-10 DIAGNOSIS — K5900 Constipation, unspecified: Secondary | ICD-10-CM | POA: Diagnosis not present

## 2021-01-10 DIAGNOSIS — Z86010 Personal history of colonic polyps: Secondary | ICD-10-CM | POA: Diagnosis not present

## 2021-01-10 DIAGNOSIS — R1012 Left upper quadrant pain: Secondary | ICD-10-CM | POA: Diagnosis not present

## 2021-01-12 ENCOUNTER — Other Ambulatory Visit: Payer: Self-pay | Admitting: Gastroenterology

## 2021-01-12 DIAGNOSIS — E119 Type 2 diabetes mellitus without complications: Secondary | ICD-10-CM | POA: Diagnosis not present

## 2021-01-12 DIAGNOSIS — Z7984 Long term (current) use of oral hypoglycemic drugs: Secondary | ICD-10-CM | POA: Diagnosis not present

## 2021-01-12 DIAGNOSIS — E042 Nontoxic multinodular goiter: Secondary | ICD-10-CM | POA: Diagnosis not present

## 2021-01-12 DIAGNOSIS — Z0000 Encounter for general adult medical examination without abnormal findings: Secondary | ICD-10-CM | POA: Diagnosis not present

## 2021-01-12 DIAGNOSIS — R1012 Left upper quadrant pain: Secondary | ICD-10-CM

## 2021-01-12 DIAGNOSIS — R109 Unspecified abdominal pain: Secondary | ICD-10-CM | POA: Diagnosis not present

## 2021-01-12 DIAGNOSIS — Z136 Encounter for screening for cardiovascular disorders: Secondary | ICD-10-CM | POA: Diagnosis not present

## 2021-01-16 DIAGNOSIS — Z23 Encounter for immunization: Secondary | ICD-10-CM | POA: Diagnosis not present

## 2021-01-16 DIAGNOSIS — J309 Allergic rhinitis, unspecified: Secondary | ICD-10-CM | POA: Diagnosis not present

## 2021-01-16 DIAGNOSIS — Z0000 Encounter for general adult medical examination without abnormal findings: Secondary | ICD-10-CM | POA: Diagnosis not present

## 2021-01-16 DIAGNOSIS — Z1389 Encounter for screening for other disorder: Secondary | ICD-10-CM | POA: Diagnosis not present

## 2021-01-19 ENCOUNTER — Other Ambulatory Visit: Payer: Medicare Other

## 2021-01-19 ENCOUNTER — Ambulatory Visit
Admission: RE | Admit: 2021-01-19 | Discharge: 2021-01-19 | Disposition: A | Discharge status: Home / Self Care | Payer: Medicare Other | Source: Ambulatory Visit | Attending: Gastroenterology | Admitting: Gastroenterology

## 2021-01-19 DIAGNOSIS — R160 Hepatomegaly, not elsewhere classified: Secondary | ICD-10-CM | POA: Diagnosis not present

## 2021-01-19 DIAGNOSIS — R1012 Left upper quadrant pain: Secondary | ICD-10-CM

## 2021-01-19 DIAGNOSIS — N281 Cyst of kidney, acquired: Secondary | ICD-10-CM | POA: Diagnosis not present

## 2021-02-03 ENCOUNTER — Other Ambulatory Visit: Payer: Self-pay | Admitting: Gastroenterology

## 2021-02-03 ENCOUNTER — Other Ambulatory Visit (HOSPITAL_COMMUNITY): Payer: Self-pay | Admitting: Gastroenterology

## 2021-02-03 DIAGNOSIS — R1012 Left upper quadrant pain: Secondary | ICD-10-CM

## 2021-02-24 ENCOUNTER — Encounter (HOSPITAL_COMMUNITY)
Admission: RE | Admit: 2021-02-24 | Discharge: 2021-02-24 | Disposition: A | Discharge status: Home / Self Care | Payer: Medicare Other | Source: Ambulatory Visit | Attending: Gastroenterology | Admitting: Gastroenterology

## 2021-02-24 ENCOUNTER — Other Ambulatory Visit: Payer: Self-pay

## 2021-02-24 DIAGNOSIS — R109 Unspecified abdominal pain: Secondary | ICD-10-CM | POA: Diagnosis not present

## 2021-02-24 DIAGNOSIS — R1012 Left upper quadrant pain: Secondary | ICD-10-CM | POA: Diagnosis not present

## 2021-02-24 MED ORDER — TECHNETIUM TC 99M MEBROFENIN IV KIT
5.4000 | PACK | Freq: Once | INTRAVENOUS | Status: AC | PRN
  Administered 2021-02-24: 5.4 via INTRAVENOUS

## 2021-04-25 DIAGNOSIS — G4700 Insomnia, unspecified: Secondary | ICD-10-CM | POA: Diagnosis not present

## 2021-06-13 DIAGNOSIS — M5431 Sciatica, right side: Secondary | ICD-10-CM | POA: Diagnosis not present

## 2021-06-13 DIAGNOSIS — L304 Erythema intertrigo: Secondary | ICD-10-CM | POA: Diagnosis not present

## 2021-06-22 DIAGNOSIS — M5451 Vertebrogenic low back pain: Secondary | ICD-10-CM | POA: Diagnosis not present

## 2021-07-11 DIAGNOSIS — L299 Pruritus, unspecified: Secondary | ICD-10-CM | POA: Diagnosis not present

## 2021-07-11 DIAGNOSIS — E042 Nontoxic multinodular goiter: Secondary | ICD-10-CM | POA: Diagnosis not present

## 2021-07-11 DIAGNOSIS — E119 Type 2 diabetes mellitus without complications: Secondary | ICD-10-CM | POA: Diagnosis not present

## 2021-11-22 ENCOUNTER — Other Ambulatory Visit: Payer: Self-pay | Admitting: Family Medicine

## 2021-11-22 DIAGNOSIS — Z1231 Encounter for screening mammogram for malignant neoplasm of breast: Secondary | ICD-10-CM

## 2021-12-01 DIAGNOSIS — M25512 Pain in left shoulder: Secondary | ICD-10-CM | POA: Diagnosis not present

## 2021-12-15 DIAGNOSIS — M5451 Vertebrogenic low back pain: Secondary | ICD-10-CM | POA: Diagnosis not present

## 2021-12-15 DIAGNOSIS — M5459 Other low back pain: Secondary | ICD-10-CM | POA: Diagnosis not present

## 2021-12-29 DIAGNOSIS — M5416 Radiculopathy, lumbar region: Secondary | ICD-10-CM | POA: Diagnosis not present

## 2022-01-01 ENCOUNTER — Ambulatory Visit
Admission: RE | Admit: 2022-01-01 | Discharge: 2022-01-01 | Disposition: A | Discharge status: Home / Self Care | Payer: Medicare Other | Source: Ambulatory Visit | Attending: Family Medicine | Admitting: Family Medicine

## 2022-01-01 DIAGNOSIS — Z1231 Encounter for screening mammogram for malignant neoplasm of breast: Secondary | ICD-10-CM

## 2022-01-05 DIAGNOSIS — M5451 Vertebrogenic low back pain: Secondary | ICD-10-CM | POA: Diagnosis not present

## 2022-01-05 DIAGNOSIS — M5136 Other intervertebral disc degeneration, lumbar region: Secondary | ICD-10-CM | POA: Diagnosis not present

## 2022-01-10 DIAGNOSIS — H524 Presbyopia: Secondary | ICD-10-CM | POA: Diagnosis not present

## 2022-01-10 DIAGNOSIS — H35033 Hypertensive retinopathy, bilateral: Secondary | ICD-10-CM | POA: Diagnosis not present

## 2022-01-10 DIAGNOSIS — H25813 Combined forms of age-related cataract, bilateral: Secondary | ICD-10-CM | POA: Diagnosis not present

## 2022-01-10 DIAGNOSIS — E119 Type 2 diabetes mellitus without complications: Secondary | ICD-10-CM | POA: Diagnosis not present

## 2022-01-10 DIAGNOSIS — H40013 Open angle with borderline findings, low risk, bilateral: Secondary | ICD-10-CM | POA: Diagnosis not present

## 2022-01-19 DIAGNOSIS — E042 Nontoxic multinodular goiter: Secondary | ICD-10-CM | POA: Diagnosis not present

## 2022-01-19 DIAGNOSIS — L299 Pruritus, unspecified: Secondary | ICD-10-CM | POA: Diagnosis not present

## 2022-01-19 DIAGNOSIS — E119 Type 2 diabetes mellitus without complications: Secondary | ICD-10-CM | POA: Diagnosis not present

## 2022-02-08 DIAGNOSIS — M5416 Radiculopathy, lumbar region: Secondary | ICD-10-CM | POA: Diagnosis not present

## 2022-02-23 DIAGNOSIS — M25512 Pain in left shoulder: Secondary | ICD-10-CM | POA: Diagnosis not present

## 2022-02-23 DIAGNOSIS — M5451 Vertebrogenic low back pain: Secondary | ICD-10-CM | POA: Diagnosis not present

## 2022-03-06 DIAGNOSIS — M5416 Radiculopathy, lumbar region: Secondary | ICD-10-CM | POA: Diagnosis not present

## 2022-03-12 DIAGNOSIS — Z0000 Encounter for general adult medical examination without abnormal findings: Secondary | ICD-10-CM | POA: Diagnosis not present

## 2022-03-12 DIAGNOSIS — Z1211 Encounter for screening for malignant neoplasm of colon: Secondary | ICD-10-CM | POA: Diagnosis not present

## 2022-03-12 DIAGNOSIS — I10 Essential (primary) hypertension: Secondary | ICD-10-CM | POA: Diagnosis not present

## 2022-03-12 DIAGNOSIS — E113291 Type 2 diabetes mellitus with mild nonproliferative diabetic retinopathy without macular edema, right eye: Secondary | ICD-10-CM | POA: Diagnosis not present

## 2022-03-12 DIAGNOSIS — Z23 Encounter for immunization: Secondary | ICD-10-CM | POA: Diagnosis not present

## 2022-03-12 DIAGNOSIS — R5383 Other fatigue: Secondary | ICD-10-CM | POA: Diagnosis not present

## 2022-03-12 DIAGNOSIS — E785 Hyperlipidemia, unspecified: Secondary | ICD-10-CM | POA: Diagnosis not present

## 2022-03-12 DIAGNOSIS — M8580 Other specified disorders of bone density and structure, unspecified site: Secondary | ICD-10-CM | POA: Diagnosis not present

## 2022-03-14 ENCOUNTER — Other Ambulatory Visit: Payer: Self-pay | Admitting: Family Medicine

## 2022-03-14 DIAGNOSIS — M8589 Other specified disorders of bone density and structure, multiple sites: Secondary | ICD-10-CM

## 2022-03-20 DIAGNOSIS — R944 Abnormal results of kidney function studies: Secondary | ICD-10-CM | POA: Diagnosis not present

## 2022-04-03 DIAGNOSIS — R944 Abnormal results of kidney function studies: Secondary | ICD-10-CM | POA: Diagnosis not present

## 2022-04-03 DIAGNOSIS — E1169 Type 2 diabetes mellitus with other specified complication: Secondary | ICD-10-CM | POA: Diagnosis not present

## 2022-04-16 HISTORY — PX: COLONOSCOPY: SHX174

## 2022-05-08 DIAGNOSIS — E119 Type 2 diabetes mellitus without complications: Secondary | ICD-10-CM | POA: Diagnosis not present

## 2022-05-08 DIAGNOSIS — E042 Nontoxic multinodular goiter: Secondary | ICD-10-CM | POA: Diagnosis not present

## 2022-06-06 DIAGNOSIS — R059 Cough, unspecified: Secondary | ICD-10-CM | POA: Diagnosis not present

## 2022-06-06 DIAGNOSIS — R52 Pain, unspecified: Secondary | ICD-10-CM | POA: Diagnosis not present

## 2022-06-06 DIAGNOSIS — R509 Fever, unspecified: Secondary | ICD-10-CM | POA: Diagnosis not present

## 2022-06-06 DIAGNOSIS — U071 COVID-19: Secondary | ICD-10-CM | POA: Diagnosis not present

## 2022-08-09 DIAGNOSIS — K5900 Constipation, unspecified: Secondary | ICD-10-CM | POA: Diagnosis not present

## 2022-08-13 ENCOUNTER — Ambulatory Visit (INDEPENDENT_AMBULATORY_CARE_PROVIDER_SITE_OTHER): Payer: Medicare Other | Admitting: Obstetrics and Gynecology

## 2022-08-13 ENCOUNTER — Encounter: Payer: Self-pay | Admitting: Obstetrics and Gynecology

## 2022-08-13 VITALS — BP 143/85 | HR 97 | Ht 64.0 in | Wt 201.0 lb

## 2022-08-13 DIAGNOSIS — Z01419 Encounter for gynecological examination (general) (routine) without abnormal findings: Secondary | ICD-10-CM | POA: Diagnosis not present

## 2022-08-13 NOTE — Progress Notes (Signed)
Subjective:    Laura Skinner is a 77 y.o. female who presents for an annual exam. The patient has no complaints today. The patient is sexually active. She denies dyspareunia. She reports decrease sensitivity at the level of the clitoris. She denies pain or abnormal discharge. This change in sensitivity took place in December and has been on going. GYN screening history: last mammogram: was normal. The patient wears seatbelts: yes. The patient reports that there is not domestic violence in her life.   Menstrual History: OB History     Gravida  1   Para      Term      Preterm      AB      Living  1      SAB      IAB      Ectopic      Multiple      Live Births              No LMP recorded. Patient is postmenopausal.   Past Medical History:  Diagnosis Date   Diabetes mellitus without complication (HCC)    Hypercholesteremia    Hypertension    Past Surgical History:  Procedure Laterality Date   ABDOMINAL HYSTERECTOMY     BREAST EXCISIONAL BIOPSY Left    neck fusion     Family History  Problem Relation Age of Onset   Breast cancer Cousin    Social History   Tobacco Use   Smoking status: Never   Smokeless tobacco: Never  Vaping Use   Vaping Use: Never used  Substance Use Topics   Alcohol use: Not Currently   Drug use: Not Currently      Review of Systems Pertinent items noted in HPI and remainder of comprehensive ROS otherwise negative.    Objective:  Blood pressure (!) 143/85, pulse 97, height 5\' 4"  (1.626 m), weight 201 lb (91.2 kg).   GENERAL: Well-developed, well-nourished female in no acute distress.  HEENT: Normocephalic, atraumatic. Sclerae anicteric.  NECK: Supple. Normal thyroid.  LUNGS: Clear to auscultation bilaterally.  HEART: Regular rate and rhythm. BREASTS: Symmetric in size. No palpable masses or lymphadenopathy, skin changes, or nipple drainage. ABDOMEN: Soft, nontender, nondistended. No organomegaly. PELVIC: Normal external  female genitalia. Vagina is pink and rugated.  Normal discharge. Vaginal vault intact. Bimanual exam limited secondary to body habitus. No adnexal mass or tenderness. Chaperone present during the pelvic exam EXTREMITIES: No cyanosis, clubbing, or edema, 2+ distal pulses. .    Assessment:    Healthy female exam.    Plan:      Pap smear no longer indicated Patient is current on dexa scan, and mammogram. She met with GI and is awaiting scheduling for colonoscopy Patient to follow up with PCP

## 2022-08-13 NOTE — Progress Notes (Signed)
Pt is new to office for routine exam. Pt states she is having a sensitivity issues.

## 2022-08-17 ENCOUNTER — Other Ambulatory Visit: Payer: Self-pay | Admitting: Internal Medicine

## 2022-08-17 DIAGNOSIS — E042 Nontoxic multinodular goiter: Secondary | ICD-10-CM | POA: Diagnosis not present

## 2022-08-17 DIAGNOSIS — E119 Type 2 diabetes mellitus without complications: Secondary | ICD-10-CM | POA: Diagnosis not present

## 2022-08-20 DIAGNOSIS — E119 Type 2 diabetes mellitus without complications: Secondary | ICD-10-CM | POA: Diagnosis not present

## 2022-08-28 ENCOUNTER — Ambulatory Visit
Admission: RE | Admit: 2022-08-28 | Discharge: 2022-08-28 | Disposition: A | Discharge status: Home / Self Care | Payer: Medicare Other | Source: Ambulatory Visit | Attending: Family Medicine | Admitting: Family Medicine

## 2022-08-28 DIAGNOSIS — M8588 Other specified disorders of bone density and structure, other site: Secondary | ICD-10-CM | POA: Diagnosis not present

## 2022-08-28 DIAGNOSIS — Z13820 Encounter for screening for osteoporosis: Secondary | ICD-10-CM | POA: Diagnosis not present

## 2022-08-28 DIAGNOSIS — E349 Endocrine disorder, unspecified: Secondary | ICD-10-CM | POA: Diagnosis not present

## 2022-08-28 DIAGNOSIS — M8589 Other specified disorders of bone density and structure, multiple sites: Secondary | ICD-10-CM

## 2022-09-07 DIAGNOSIS — M5136 Other intervertebral disc degeneration, lumbar region: Secondary | ICD-10-CM | POA: Diagnosis not present

## 2022-09-07 DIAGNOSIS — M5451 Vertebrogenic low back pain: Secondary | ICD-10-CM | POA: Diagnosis not present

## 2022-09-17 ENCOUNTER — Ambulatory Visit
Admission: RE | Admit: 2022-09-17 | Discharge: 2022-09-17 | Disposition: A | Discharge status: Home / Self Care | Payer: Medicare Other | Source: Ambulatory Visit | Attending: Internal Medicine | Admitting: Internal Medicine

## 2022-09-17 DIAGNOSIS — E042 Nontoxic multinodular goiter: Secondary | ICD-10-CM

## 2022-09-25 DIAGNOSIS — M5416 Radiculopathy, lumbar region: Secondary | ICD-10-CM | POA: Diagnosis not present

## 2022-09-28 IMAGING — NM NM HEPATO W/GB/PHARM/[PERSON_NAME]
2 series · 12 of 12 positions shown · non-contrast
Comparison: Ultrasound January 19, 2021

CLINICAL DATA: Abdominal pain

EXAM:
NUCLEAR MEDICINE HEPATOBILIARY IMAGING WITH GALLBLADDER EF
TECHNIQUE: Sequential images of the abdomen were obtained [DATE] minutes
following intravenous administration of radiopharmaceutical. After
oral ingestion of Ensure, gallbladder ejection fraction was
determined. At 60 min, normal ejection fraction is greater than 33%.
RADIOPHARMACEUTICALS:  5.4 mCi 0c-88m  Choletec IV

[Series 1: biliary · 4.14mm/px · 6 of 60 frames shown]
[frame 6/60]
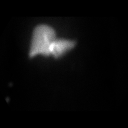
[frame 16/60]
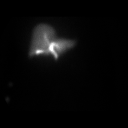
[frame 26/60]
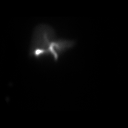
[frame 36/60]
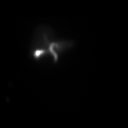
[frame 46/60]
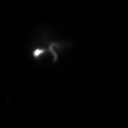
[frame 56/60]
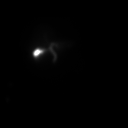

[Series 2: gbef · 4.14mm/px · 6 of 60 frames shown]
[frame 6/60]
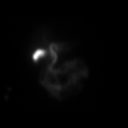
[frame 16/60]
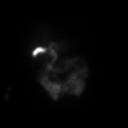
[frame 26/60]
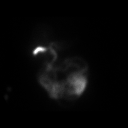
[frame 36/60]
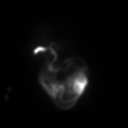
[frame 46/60]
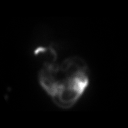
[frame 56/60]
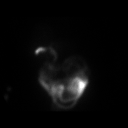

[12 of 12 positions shown; findings below may reference images not displayed]

FINDINGS: There is prompt, uniform radiotracer uptake by the liver with normal
filling of the intrahepatic ducts, common bile duct. Gallbladder
activity is visualized, consistent with patency of cystic duct
(normal < 60 minutes). Additionally there is normal biliary to bowel
transit (normal < 60 minutes), consistent with patent common bile
duct.

Ensure was administered and the gallbladder appears to empty
normally on sequential images. Calculated gallbladder ejection
fraction is 68%. (Normal gallbladder ejection fraction with Ensure
is greater than 33%.)

No evidence of enterogastric biliary reflux.
IMPRESSION: 1.  Patent cystic and common bile ducts.

2.  Normal gallbladder ejection fraction.

## 2022-10-02 DIAGNOSIS — N1832 Chronic kidney disease, stage 3b: Secondary | ICD-10-CM | POA: Diagnosis not present

## 2022-10-02 DIAGNOSIS — H35033 Hypertensive retinopathy, bilateral: Secondary | ICD-10-CM | POA: Diagnosis not present

## 2022-10-02 DIAGNOSIS — M8589 Other specified disorders of bone density and structure, multiple sites: Secondary | ICD-10-CM | POA: Diagnosis not present

## 2022-10-02 DIAGNOSIS — E1122 Type 2 diabetes mellitus with diabetic chronic kidney disease: Secondary | ICD-10-CM | POA: Diagnosis not present

## 2022-10-02 DIAGNOSIS — I10 Essential (primary) hypertension: Secondary | ICD-10-CM | POA: Diagnosis not present

## 2022-10-02 DIAGNOSIS — R944 Abnormal results of kidney function studies: Secondary | ICD-10-CM | POA: Diagnosis not present

## 2022-10-02 DIAGNOSIS — Z794 Long term (current) use of insulin: Secondary | ICD-10-CM | POA: Diagnosis not present

## 2022-10-02 DIAGNOSIS — K5900 Constipation, unspecified: Secondary | ICD-10-CM | POA: Diagnosis not present

## 2022-10-02 DIAGNOSIS — E785 Hyperlipidemia, unspecified: Secondary | ICD-10-CM | POA: Diagnosis not present

## 2022-10-11 ENCOUNTER — Other Ambulatory Visit: Payer: Self-pay | Admitting: Internal Medicine

## 2022-10-11 DIAGNOSIS — Z800 Family history of malignant neoplasm of digestive organs: Secondary | ICD-10-CM | POA: Diagnosis not present

## 2022-10-11 DIAGNOSIS — K5730 Diverticulosis of large intestine without perforation or abscess without bleeding: Secondary | ICD-10-CM | POA: Diagnosis not present

## 2022-10-11 DIAGNOSIS — Z1211 Encounter for screening for malignant neoplasm of colon: Secondary | ICD-10-CM | POA: Diagnosis not present

## 2022-10-11 DIAGNOSIS — E042 Nontoxic multinodular goiter: Secondary | ICD-10-CM

## 2022-10-11 DIAGNOSIS — K640 First degree hemorrhoids: Secondary | ICD-10-CM | POA: Diagnosis not present

## 2022-10-11 DIAGNOSIS — D120 Benign neoplasm of cecum: Secondary | ICD-10-CM | POA: Diagnosis not present

## 2022-10-11 DIAGNOSIS — D124 Benign neoplasm of descending colon: Secondary | ICD-10-CM | POA: Diagnosis not present

## 2022-11-14 ENCOUNTER — Other Ambulatory Visit: Payer: Self-pay | Admitting: Family Medicine

## 2022-11-14 DIAGNOSIS — Z1231 Encounter for screening mammogram for malignant neoplasm of breast: Secondary | ICD-10-CM

## 2022-11-30 ENCOUNTER — Other Ambulatory Visit (HOSPITAL_COMMUNITY)
Admission: RE | Admit: 2022-11-30 | Payer: Medicare Other | Source: Ambulatory Visit | Admitting: Interventional Radiology

## 2022-11-30 ENCOUNTER — Ambulatory Visit: Admission: RE | Admit: 2022-11-30 | Payer: Medicare Other | Source: Ambulatory Visit

## 2022-11-30 DIAGNOSIS — E042 Nontoxic multinodular goiter: Secondary | ICD-10-CM

## 2022-11-30 DIAGNOSIS — E041 Nontoxic single thyroid nodule: Secondary | ICD-10-CM | POA: Diagnosis not present

## 2022-12-03 LAB — CYTOLOGY - NON PAP

## 2022-12-24 ENCOUNTER — Other Ambulatory Visit: Payer: Self-pay | Admitting: Internal Medicine

## 2022-12-24 DIAGNOSIS — E042 Nontoxic multinodular goiter: Secondary | ICD-10-CM

## 2023-01-10 ENCOUNTER — Ambulatory Visit
Admission: RE | Admit: 2023-01-10 | Discharge: 2023-01-10 | Disposition: A | Discharge status: Home / Self Care | Payer: Medicare Other | Source: Ambulatory Visit | Attending: Family Medicine | Admitting: Family Medicine

## 2023-01-10 DIAGNOSIS — Z1231 Encounter for screening mammogram for malignant neoplasm of breast: Secondary | ICD-10-CM

## 2023-01-18 DIAGNOSIS — E119 Type 2 diabetes mellitus without complications: Secondary | ICD-10-CM | POA: Diagnosis not present

## 2023-01-18 DIAGNOSIS — H25813 Combined forms of age-related cataract, bilateral: Secondary | ICD-10-CM | POA: Diagnosis not present

## 2023-01-18 DIAGNOSIS — H35031 Hypertensive retinopathy, right eye: Secondary | ICD-10-CM | POA: Diagnosis not present

## 2023-01-18 DIAGNOSIS — H524 Presbyopia: Secondary | ICD-10-CM | POA: Diagnosis not present

## 2023-01-18 DIAGNOSIS — H40013 Open angle with borderline findings, low risk, bilateral: Secondary | ICD-10-CM | POA: Diagnosis not present

## 2023-02-08 ENCOUNTER — Ambulatory Visit
Admission: RE | Admit: 2023-02-08 | Discharge: 2023-02-08 | Disposition: A | Discharge status: Home / Self Care | Payer: No Typology Code available for payment source | Source: Ambulatory Visit | Attending: Internal Medicine | Admitting: Internal Medicine

## 2023-02-08 ENCOUNTER — Other Ambulatory Visit (HOSPITAL_COMMUNITY)
Admission: RE | Admit: 2023-02-08 | Discharge: 2023-02-08 | Disposition: A | Discharge status: Home / Self Care | Payer: Medicare Other | Source: Ambulatory Visit | Attending: Interventional Radiology | Admitting: Interventional Radiology

## 2023-02-08 DIAGNOSIS — E042 Nontoxic multinodular goiter: Secondary | ICD-10-CM

## 2023-02-12 LAB — CYTOLOGY - NON PAP

## 2023-02-21 DIAGNOSIS — E119 Type 2 diabetes mellitus without complications: Secondary | ICD-10-CM | POA: Diagnosis not present

## 2023-02-21 DIAGNOSIS — E042 Nontoxic multinodular goiter: Secondary | ICD-10-CM | POA: Diagnosis not present

## 2023-02-22 DIAGNOSIS — E1136 Type 2 diabetes mellitus with diabetic cataract: Secondary | ICD-10-CM | POA: Diagnosis not present

## 2023-02-22 DIAGNOSIS — H6502 Acute serous otitis media, left ear: Secondary | ICD-10-CM | POA: Diagnosis not present

## 2023-02-22 DIAGNOSIS — M792 Neuralgia and neuritis, unspecified: Secondary | ICD-10-CM | POA: Diagnosis not present

## 2023-03-18 DIAGNOSIS — Z0000 Encounter for general adult medical examination without abnormal findings: Secondary | ICD-10-CM | POA: Diagnosis not present

## 2023-03-18 DIAGNOSIS — E1169 Type 2 diabetes mellitus with other specified complication: Secondary | ICD-10-CM | POA: Diagnosis not present

## 2023-03-18 DIAGNOSIS — E1122 Type 2 diabetes mellitus with diabetic chronic kidney disease: Secondary | ICD-10-CM | POA: Diagnosis not present

## 2023-03-18 DIAGNOSIS — M8589 Other specified disorders of bone density and structure, multiple sites: Secondary | ICD-10-CM | POA: Diagnosis not present

## 2023-03-18 DIAGNOSIS — Z23 Encounter for immunization: Secondary | ICD-10-CM | POA: Diagnosis not present

## 2023-03-18 DIAGNOSIS — N1832 Chronic kidney disease, stage 3b: Secondary | ICD-10-CM | POA: Diagnosis not present

## 2023-03-18 DIAGNOSIS — E785 Hyperlipidemia, unspecified: Secondary | ICD-10-CM | POA: Diagnosis not present

## 2023-05-09 DIAGNOSIS — M5441 Lumbago with sciatica, right side: Secondary | ICD-10-CM | POA: Diagnosis not present

## 2023-05-16 DIAGNOSIS — M545 Low back pain, unspecified: Secondary | ICD-10-CM | POA: Diagnosis not present

## 2023-05-16 DIAGNOSIS — M5416 Radiculopathy, lumbar region: Secondary | ICD-10-CM | POA: Diagnosis not present

## 2023-05-16 DIAGNOSIS — M4316 Spondylolisthesis, lumbar region: Secondary | ICD-10-CM | POA: Diagnosis not present

## 2023-07-31 DIAGNOSIS — N3000 Acute cystitis without hematuria: Secondary | ICD-10-CM | POA: Diagnosis not present

## 2023-08-20 DIAGNOSIS — R609 Edema, unspecified: Secondary | ICD-10-CM | POA: Diagnosis not present

## 2023-08-20 DIAGNOSIS — E119 Type 2 diabetes mellitus without complications: Secondary | ICD-10-CM | POA: Diagnosis not present

## 2023-08-20 DIAGNOSIS — E042 Nontoxic multinodular goiter: Secondary | ICD-10-CM | POA: Diagnosis not present

## 2023-08-27 DIAGNOSIS — M5416 Radiculopathy, lumbar region: Secondary | ICD-10-CM | POA: Diagnosis not present

## 2023-08-27 DIAGNOSIS — M4316 Spondylolisthesis, lumbar region: Secondary | ICD-10-CM | POA: Diagnosis not present

## 2023-08-27 DIAGNOSIS — M48062 Spinal stenosis, lumbar region with neurogenic claudication: Secondary | ICD-10-CM | POA: Diagnosis not present

## 2023-09-14 DIAGNOSIS — M4807 Spinal stenosis, lumbosacral region: Secondary | ICD-10-CM | POA: Diagnosis not present

## 2023-09-14 DIAGNOSIS — M48061 Spinal stenosis, lumbar region without neurogenic claudication: Secondary | ICD-10-CM | POA: Diagnosis not present

## 2023-09-17 DIAGNOSIS — E042 Nontoxic multinodular goiter: Secondary | ICD-10-CM | POA: Diagnosis not present

## 2023-09-17 DIAGNOSIS — E113291 Type 2 diabetes mellitus with mild nonproliferative diabetic retinopathy without macular edema, right eye: Secondary | ICD-10-CM | POA: Diagnosis not present

## 2023-09-17 DIAGNOSIS — E559 Vitamin D deficiency, unspecified: Secondary | ICD-10-CM | POA: Diagnosis not present

## 2023-09-17 DIAGNOSIS — E785 Hyperlipidemia, unspecified: Secondary | ICD-10-CM | POA: Diagnosis not present

## 2023-09-17 DIAGNOSIS — N1832 Chronic kidney disease, stage 3b: Secondary | ICD-10-CM | POA: Diagnosis not present

## 2023-09-17 DIAGNOSIS — I10 Essential (primary) hypertension: Secondary | ICD-10-CM | POA: Diagnosis not present

## 2023-09-17 DIAGNOSIS — M5431 Sciatica, right side: Secondary | ICD-10-CM | POA: Diagnosis not present

## 2023-09-19 ENCOUNTER — Other Ambulatory Visit: Payer: Self-pay | Admitting: Family Medicine

## 2023-09-19 DIAGNOSIS — Z1231 Encounter for screening mammogram for malignant neoplasm of breast: Secondary | ICD-10-CM

## 2023-10-09 DIAGNOSIS — M48062 Spinal stenosis, lumbar region with neurogenic claudication: Secondary | ICD-10-CM | POA: Diagnosis not present

## 2023-10-09 DIAGNOSIS — M5416 Radiculopathy, lumbar region: Secondary | ICD-10-CM | POA: Diagnosis not present

## 2023-10-09 DIAGNOSIS — M4316 Spondylolisthesis, lumbar region: Secondary | ICD-10-CM | POA: Diagnosis not present

## 2023-10-30 ENCOUNTER — Ambulatory Visit: Admitting: Podiatry

## 2023-10-30 ENCOUNTER — Encounter: Payer: Self-pay | Admitting: Podiatry

## 2023-10-30 DIAGNOSIS — M25572 Pain in left ankle and joints of left foot: Secondary | ICD-10-CM | POA: Diagnosis not present

## 2023-10-30 DIAGNOSIS — M25571 Pain in right ankle and joints of right foot: Secondary | ICD-10-CM | POA: Diagnosis not present

## 2023-10-30 DIAGNOSIS — M79671 Pain in right foot: Secondary | ICD-10-CM

## 2023-10-30 DIAGNOSIS — B351 Tinea unguium: Secondary | ICD-10-CM

## 2023-10-30 DIAGNOSIS — M79672 Pain in left foot: Secondary | ICD-10-CM

## 2023-10-30 NOTE — Progress Notes (Signed)
 Patient presents for evaluation and treatment of tenderness and some redness around nails feet.  Tenderness around toes with walking and wearing shoes.  Also complains of pain in the plantar aspect of the hallux on the plantar aspect of the IPJ bilaterally.  This bothers her when she is walking.  No history of any ulcerations.  She is type II diabetic  Physical exam:  General appearance: Alert, pleasant, and in no acute distress.  Vascular: Pedal pulses: DP 2/4 B/L, PT 1/4 B/L.  Mild edema lower legs bilaterally.  Capillary refill time immediate  Neurological:  Light touch intact.  Normal Achilles tendon reflex.  Slight diminishment in vibratory sensation feet bilaterally  Dermatologic:  Nails thickened, disfigured, discolored 1-5 BL with subungual debris.  Redness and hypertrophic nail folds along nail folds bilaterally but no signs of drainage or infection.  Skin thin and atrophic with no hair growth lower extremities and areas of hyperpigmentation.  Musculoskeletal:  Soreness under the IPJ of the hallux bilaterally.  Good range of motion without tenderness at the first MTP bilaterally.  Hyperextension of IPJ of the hallux bilaterally.  No crepitation with range of motion.   Diagnosis: 1.  Arthralgia IPJ hallux bilaterally 2. Painful onychomycotic nails 1 through 5 bilaterally. 3. Pain toes 1 through 5 bilaterally.  Plan: -New patient office visit level 3 for evaluation and management.  Modifier 25. -Discussed with him the tenderness on the plantar aspect of the hallux bilaterally.  Today we dispensed sleeves for the toes to help cushion them.  Recommended good supportive shoes with good cushioning.  Some mild neuropathy could be contributing to the symptoms also.  Discussed proper shoe gear and socks. -Debrided onychomycotic nails 1 through 5 bilaterally.  Return 3 months [facility]

## 2023-11-07 DIAGNOSIS — M48062 Spinal stenosis, lumbar region with neurogenic claudication: Secondary | ICD-10-CM | POA: Diagnosis not present

## 2023-11-07 DIAGNOSIS — M5116 Intervertebral disc disorders with radiculopathy, lumbar region: Secondary | ICD-10-CM | POA: Diagnosis not present

## 2023-11-13 DIAGNOSIS — M791 Myalgia, unspecified site: Secondary | ICD-10-CM | POA: Diagnosis not present

## 2023-11-13 DIAGNOSIS — M542 Cervicalgia: Secondary | ICD-10-CM | POA: Diagnosis not present

## 2023-11-13 DIAGNOSIS — M5013 Cervical disc disorder with radiculopathy, cervicothoracic region: Secondary | ICD-10-CM | POA: Diagnosis not present

## 2023-11-19 DIAGNOSIS — N1831 Chronic kidney disease, stage 3a: Secondary | ICD-10-CM | POA: Diagnosis not present

## 2023-11-19 DIAGNOSIS — E119 Type 2 diabetes mellitus without complications: Secondary | ICD-10-CM | POA: Diagnosis not present

## 2023-11-19 DIAGNOSIS — E042 Nontoxic multinodular goiter: Secondary | ICD-10-CM | POA: Diagnosis not present

## 2023-11-26 DIAGNOSIS — N289 Disorder of kidney and ureter, unspecified: Secondary | ICD-10-CM | POA: Diagnosis not present

## 2023-11-27 DIAGNOSIS — M4316 Spondylolisthesis, lumbar region: Secondary | ICD-10-CM | POA: Diagnosis not present

## 2023-11-27 DIAGNOSIS — M5416 Radiculopathy, lumbar region: Secondary | ICD-10-CM | POA: Diagnosis not present

## 2023-11-29 DIAGNOSIS — M4722 Other spondylosis with radiculopathy, cervical region: Secondary | ICD-10-CM | POA: Diagnosis not present

## 2023-12-04 DIAGNOSIS — R7989 Other specified abnormal findings of blood chemistry: Secondary | ICD-10-CM | POA: Diagnosis not present

## 2024-01-02 DIAGNOSIS — M5416 Radiculopathy, lumbar region: Secondary | ICD-10-CM | POA: Diagnosis not present

## 2024-01-02 DIAGNOSIS — M542 Cervicalgia: Secondary | ICD-10-CM | POA: Diagnosis not present

## 2024-01-02 DIAGNOSIS — M48062 Spinal stenosis, lumbar region with neurogenic claudication: Secondary | ICD-10-CM | POA: Diagnosis not present

## 2024-01-08 DIAGNOSIS — R944 Abnormal results of kidney function studies: Secondary | ICD-10-CM | POA: Diagnosis not present

## 2024-01-08 DIAGNOSIS — N189 Chronic kidney disease, unspecified: Secondary | ICD-10-CM | POA: Diagnosis not present

## 2024-01-10 DIAGNOSIS — N281 Cyst of kidney, acquired: Secondary | ICD-10-CM | POA: Diagnosis not present

## 2024-01-10 DIAGNOSIS — R944 Abnormal results of kidney function studies: Secondary | ICD-10-CM | POA: Diagnosis not present

## 2024-01-14 ENCOUNTER — Ambulatory Visit
Admission: RE | Admit: 2024-01-14 | Discharge: 2024-01-14 | Disposition: A | Discharge status: Home / Self Care | Source: Ambulatory Visit | Attending: Family Medicine | Admitting: Family Medicine

## 2024-01-14 DIAGNOSIS — Z1231 Encounter for screening mammogram for malignant neoplasm of breast: Secondary | ICD-10-CM

## 2024-01-16 DIAGNOSIS — I129 Hypertensive chronic kidney disease with stage 1 through stage 4 chronic kidney disease, or unspecified chronic kidney disease: Secondary | ICD-10-CM | POA: Diagnosis not present

## 2024-01-16 DIAGNOSIS — N2581 Secondary hyperparathyroidism of renal origin: Secondary | ICD-10-CM | POA: Diagnosis not present

## 2024-01-16 DIAGNOSIS — N1832 Chronic kidney disease, stage 3b: Secondary | ICD-10-CM | POA: Diagnosis not present

## 2024-01-16 DIAGNOSIS — D631 Anemia in chronic kidney disease: Secondary | ICD-10-CM | POA: Diagnosis not present

## 2024-01-22 DIAGNOSIS — R1010 Upper abdominal pain, unspecified: Secondary | ICD-10-CM | POA: Diagnosis not present

## 2024-01-22 DIAGNOSIS — R109 Unspecified abdominal pain: Secondary | ICD-10-CM | POA: Diagnosis not present

## 2024-01-22 DIAGNOSIS — R194 Change in bowel habit: Secondary | ICD-10-CM | POA: Diagnosis not present

## 2024-01-28 ENCOUNTER — Ambulatory Visit: Admitting: Podiatry

## 2024-01-28 DIAGNOSIS — R1010 Upper abdominal pain, unspecified: Secondary | ICD-10-CM | POA: Diagnosis not present

## 2024-01-30 DIAGNOSIS — E119 Type 2 diabetes mellitus without complications: Secondary | ICD-10-CM | POA: Diagnosis not present

## 2024-01-30 DIAGNOSIS — H40013 Open angle with borderline findings, low risk, bilateral: Secondary | ICD-10-CM | POA: Diagnosis not present

## 2024-01-30 DIAGNOSIS — H25813 Combined forms of age-related cataract, bilateral: Secondary | ICD-10-CM | POA: Diagnosis not present

## 2024-01-30 DIAGNOSIS — H524 Presbyopia: Secondary | ICD-10-CM | POA: Diagnosis not present

## 2024-02-10 DIAGNOSIS — L821 Other seborrheic keratosis: Secondary | ICD-10-CM | POA: Diagnosis not present

## 2024-02-10 DIAGNOSIS — L820 Inflamed seborrheic keratosis: Secondary | ICD-10-CM | POA: Diagnosis not present

## 2024-03-10 ENCOUNTER — Ambulatory Visit: Admitting: Podiatry

## 2024-03-25 ENCOUNTER — Other Ambulatory Visit: Payer: Self-pay | Admitting: Internal Medicine

## 2024-03-25 DIAGNOSIS — R109 Unspecified abdominal pain: Secondary | ICD-10-CM

## 2024-03-25 DIAGNOSIS — R194 Change in bowel habit: Secondary | ICD-10-CM

## 2024-04-01 ENCOUNTER — Encounter: Payer: Self-pay | Admitting: Radiology

## 2024-04-01 ENCOUNTER — Inpatient Hospital Stay: Admission: RE | Admit: 2024-04-01 | Discharge: 2024-04-01 | Attending: Internal Medicine

## 2024-04-01 DIAGNOSIS — R109 Unspecified abdominal pain: Secondary | ICD-10-CM

## 2024-04-01 DIAGNOSIS — R194 Change in bowel habit: Secondary | ICD-10-CM

## 2024-04-01 MED ORDER — IOHEXOL 300 MG/ML  SOLN
75.0000 mL | Freq: Once | INTRAMUSCULAR | Status: AC | PRN
  Administered 2024-04-01: 12:00:00 75 mL via INTRAVENOUS

## 2024-04-06 ENCOUNTER — Encounter: Payer: Self-pay | Admitting: Physician Assistant

## 2024-04-06 ENCOUNTER — Ambulatory Visit: Attending: Physician Assistant | Admitting: Physician Assistant

## 2024-04-06 VITALS — BP 158/82 | HR 78 | Ht 64.0 in | Wt 201.0 lb

## 2024-04-06 DIAGNOSIS — K769 Liver disease, unspecified: Secondary | ICD-10-CM | POA: Diagnosis not present

## 2024-04-06 DIAGNOSIS — N183 Chronic kidney disease, stage 3 unspecified: Secondary | ICD-10-CM | POA: Diagnosis not present

## 2024-04-06 DIAGNOSIS — E785 Hyperlipidemia, unspecified: Secondary | ICD-10-CM | POA: Diagnosis not present

## 2024-04-06 DIAGNOSIS — R011 Cardiac murmur, unspecified: Secondary | ICD-10-CM

## 2024-04-06 NOTE — Progress Notes (Unsigned)
 " Cardiology Office Note   Date:  04/07/2024  ID:  Edan, Serratore [birth year], MRN [id number] PCP: Camille Galloway, MD (Inactive)  [facility] Providers Cardiologist:  [facility]     History of Present Illness Laura Skinner is a 78 y.o. female with past medical history of anxiety/depression, multinodular goiter followed by endocrinology, vitamin D deficiency, hypertension, hyperlipidemia, DM2 and CKD stage III followed by Dr. Tobie of [facility].  She is on Ozempic and Farxiga for diabetes.  Patient was recently seen by PCP on 03/24/2024 and was referred to cardiology service for evaluation of heart murmur.  Patient presents today for [facility] of heart murmur.  This was recently picked up on physical exam by her PCP.  Her PCP is leaving the practice, she has not met her new PCP yet.  She says she will be set up with Dr. Schuyler Pillar in about 6 months.  She denies any exertional chest pain or shortness of breath.  She usually exercise by walking around the track and she can walk more than 4 laps without limitation.  Her blood pressure is high however she has not taken her blood pressure medication yet.  She says she just had new lipid panel this month and her LDL remain greater than 100, rosuvastatin was increased to 10 mg daily.  Given hypertension, hyperlipidemia and diabetes, I would recommend new LDL goal to be less than 70.  She was seen by GI physician and had a CT of abdomen on 04/01/2024.  This showed a moderate hiatal hernia, aortic atherosclerosis, sigmoid diverticulosis and 18 x 15 mm complex low-density lesion in the inferior portion of the lateral segment of the left hepatic lobe,  while this may represent hemangioma, other pathology could not be excluded.  Radiologist recommended MRI to further confirm the diagnosis.  She says she missed a phone call from GI physician, I will defer to GI service to order MRI.  On physical exam, she has 1 out of 6  heart murmur in the aortic valve area, 2 out of 6 heart murmur in the mitral valve area.  She has known about her murmur in the past however has never underwent cardiac workup.  She has a sister who has heart failure and one of her brother recently underwent bypass surgery.  She herself has never seen a cardiologist.  I recommended echocardiogram, I will see the patient back in 6 weeks for reassessment.  ROS:   She denies chest pain, palpitations, dyspnea, pnd, orthopnea, n, v, dizziness, syncope, edema, weight gain, or early satiety. All other systems reviewed and are otherwise negative except as noted above.    Studies Reviewed EKG Interpretation Date/Time:  Monday April 06 2024 08:42:38 EST Ventricular Rate:  78 PR Interval:  214 QRS Duration:  66 QT Interval:  366 QTC Calculation: 417 R Axis:   31  Text Interpretation: Sinus rhythm with 1st degree A-V block No previous ECGs available Confirmed by Janene Boer [phone]) on 04/06/2024 9:34:27 AM        Risk Assessment/Calculations          Physical Exam VS:  BP (!) 158/82   Pulse 78   Ht 5' 4 (1.626 m)   Wt 201 lb (91.2 kg)   SpO2 100%   BMI 34.50 kg/m        Wt Readings from Last 3 Encounters:  04/06/24 201 lb (91.2 kg)  08/13/22 201 lb (91.2 kg)  10/20/19 226 lb (102.5  kg)    GEN: Well nourished, well developed in no acute distress NECK: No JVD; No carotid bruits CARDIAC: RRR, no rubs, gallops.  Patient has heart murmur at both aortic and mitral location. RESPIRATORY:  Clear to auscultation without rales, wheezing or rhonchi  ABDOMEN: Soft, non-tender, non-distended EXTREMITIES:  No edema; No deformity   ASSESSMENT AND PLAN  Heart murmur: Will obtain echocardiogram to further assess.  She denies any heart failure symptoms.  CKD stage III: Followed by nephrology service  Hyperlipidemia: Rosuvastatin was increased to 10 mg daily.  Given hypertension, hyperlipidemia and diabetes, would recommend keep LDL goal less  than 70.  Liver lesion: Recently seen by GI service who ordered a CT of abdomen on 04/01/2024.  This showed a moderate hiatal hernia, aortic atherosclerosis, sigmoid diverticulosis and 18 x 15 mm complex low-density lesion in the inferior portion of the lateral segment of the left hepatic lobe,  while this may represent hemangioma, other pathology could not be excluded.  Radiologist recommended MRI to further confirm the diagnosis.  She says she missed a phone call from GI physician, I will defer to GI service to order MRI        Dispo: Follow-up in 6 weeks.  Signed, Scot Ford, PA  "

## 2024-04-06 NOTE — Patient Instructions (Addendum)
 Medication Instructions:  Your physician recommends that you continue on your current medications as directed. Please refer to the Current Medication list given to you today.  *If you need a refill on your cardiac medications before your next appointment, please call your pharmacy*  Lab Work: None ordered  If you have labs (blood work) drawn today and your tests are completely normal, you will receive your results only by: MyChart Message (if you have MyChart) OR A paper copy in the mail If you have any lab test that is abnormal or we need to change your treatment, we will call you to review the results.  Testing/Procedures: Your physician has requested that you have an echocardiogram. Echocardiography is a painless test that uses sound waves to create images of your heart. It provides your doctor with information about the size and shape of your heart and how well your hearts chambers and valves are working. This procedure takes approximately one hour. There are no restrictions for this procedure. Please do NOT wear cologne, perfume, aftershave, or lotions (deodorant is allowed). Please arrive 15 minutes prior to your appointment time.  Please note: We ask at that you not bring children with you during ultrasound (echo/ vascular) testing. Due to room size and safety concerns, children are not allowed in the ultrasound rooms during exams. Our front office staff cannot provide observation of children in our lobby area while testing is being conducted. An adult accompanying a patient to their appointment will only be allowed in the ultrasound room at the discretion of the ultrasound technician under special circumstances. We apologize for any inconvenience.   Follow-Up: At [facility], you and your health needs are our priority.  As part of our continuing mission to provide you with exceptional heart care, our providers are all part of one team.  This team includes your primary  Cardiologist (physician) and Advanced Practice Providers or APPs (Physician Assistants and Nurse Practitioners) who all work together to provide you with the care you need, when you need it.  Your next appointment:   6 week(s) (ALREADY SCHEDULED)  Provider:   Hao Meng, PA-C          We recommend signing up for the patient portal called MyChart.  Sign up information is provided on this After Visit Summary.  MyChart is used to connect with patients for Virtual Visits (Telemedicine).  Patients are able to view lab/test results, encounter notes, upcoming appointments, etc.  Non-urgent messages can be sent to your provider as well.   To learn more about what you can do with MyChart, go to [email or web address].   Other Instructions

## 2024-04-07 ENCOUNTER — Other Ambulatory Visit: Payer: Self-pay | Admitting: Internal Medicine

## 2024-04-07 DIAGNOSIS — R935 Abnormal findings on diagnostic imaging of other abdominal regions, including retroperitoneum: Secondary | ICD-10-CM

## 2024-04-27 ENCOUNTER — Ambulatory Visit
Admission: RE | Admit: 2024-04-27 | Discharge: 2024-04-27 | Disposition: A | Source: Ambulatory Visit | Attending: Internal Medicine

## 2024-04-27 DIAGNOSIS — R935 Abnormal findings on diagnostic imaging of other abdominal regions, including retroperitoneum: Secondary | ICD-10-CM

## 2024-04-27 MED ORDER — GADOPICLENOL 0.5 MMOL/ML IV SOLN
9.0000 mL | Freq: Once | INTRAVENOUS | Status: AC | PRN
  Administered 2024-04-27: 9 mL via INTRAVENOUS

## 2024-04-28 ENCOUNTER — Ambulatory Visit: Admitting: Podiatry

## 2024-04-28 DIAGNOSIS — M79672 Pain in left foot: Secondary | ICD-10-CM

## 2024-04-28 DIAGNOSIS — B351 Tinea unguium: Secondary | ICD-10-CM | POA: Diagnosis not present

## 2024-04-28 DIAGNOSIS — M79671 Pain in right foot: Secondary | ICD-10-CM

## 2024-04-28 NOTE — Progress Notes (Signed)
 Patient presents for evaluation and treatment of tenderness and some redness around nails feet.  Tenderness around toes with walking and wearing shoes.  Some tenderness on the fifth toe between the 4th and 5th toes.  Physical exam:  General appearance: Alert, pleasant, and in no acute distress.  Vascular: Pedal pulses: DP 2/4 B/L, PT 1/4 B/L. MIld edema lower legs bilaterally.  Capillary refill time immediate bilaterally  Neurologic:  Dermatologic:  Nails thickened, disfigured, discolored 1-5 BL with subungual debris.  Redness and hypertrophic nail folds along nail folds bilaterally but no signs of drainage or infection.  Musculoskeletal:  Hammertoes 4 and 5 left.  Tenderness over the medial aspect of the PIPJ of the fifth toe.   Diagnosis: 1. Painful onychomycotic nails 1 through 5 bilaterally. 2. Pain toes 1 through 5 bilaterally.  Plan: -Debrided onychomycotic nails 1 through 5 bilaterally.  Sharply debrided nails with nail clipper and reduced with a power bur.  -Dispensed silicone sleeve for the fifth toe left  Return 3 months [facility]

## 2024-05-14 ENCOUNTER — Ambulatory Visit (HOSPITAL_COMMUNITY)

## 2024-05-22 ENCOUNTER — Ambulatory Visit: Admitting: Physician Assistant

## 2024-06-16 ENCOUNTER — Ambulatory Visit (HOSPITAL_COMMUNITY)

## 2024-06-25 ENCOUNTER — Ambulatory Visit: Admitting: Physician Assistant

## 2024-07-28 ENCOUNTER — Ambulatory Visit: Admitting: Podiatry
